# Patient Record
Sex: Female | Born: 1950 | Hispanic: No | Marital: Married | Smoking: Never smoker
Health system: Southern US, Community
[De-identification: ages and names within clinical notes are randomized; demographics above are authoritative.]

## PROBLEM LIST (undated history)

## (undated) ENCOUNTER — Emergency Department (HOSPITAL_COMMUNITY): Admission: EM | Payer: PRIVATE HEALTH INSURANCE | Source: Home / Self Care

## (undated) DIAGNOSIS — M1990 Unspecified osteoarthritis, unspecified site: Secondary | ICD-10-CM

## (undated) DIAGNOSIS — S82899A Other fracture of unspecified lower leg, initial encounter for closed fracture: Secondary | ICD-10-CM

## (undated) DIAGNOSIS — I471 Supraventricular tachycardia, unspecified: Secondary | ICD-10-CM

## (undated) DIAGNOSIS — J45909 Unspecified asthma, uncomplicated: Secondary | ICD-10-CM

## (undated) DIAGNOSIS — T7840XA Allergy, unspecified, initial encounter: Secondary | ICD-10-CM

## (undated) DIAGNOSIS — N189 Chronic kidney disease, unspecified: Secondary | ICD-10-CM

## (undated) DIAGNOSIS — E079 Disorder of thyroid, unspecified: Secondary | ICD-10-CM

## (undated) HISTORY — DX: Supraventricular tachycardia: I47.1

## (undated) HISTORY — DX: Allergy, unspecified, initial encounter: T78.40XA

## (undated) HISTORY — DX: Other fracture of unspecified lower leg, initial encounter for closed fracture: S82.899A

## (undated) HISTORY — DX: Chronic kidney disease, unspecified: N18.9

## (undated) HISTORY — DX: Unspecified osteoarthritis, unspecified site: M19.90

## (undated) HISTORY — DX: Unspecified asthma, uncomplicated: J45.909

## (undated) HISTORY — PX: FRACTURE SURGERY: SHX138

## (undated) HISTORY — DX: Supraventricular tachycardia, unspecified: I47.10

---

## 1978-11-10 DIAGNOSIS — S82899A Other fracture of unspecified lower leg, initial encounter for closed fracture: Secondary | ICD-10-CM

## 1978-11-10 HISTORY — PX: ANKLE SURGERY: SHX546

## 1978-11-10 HISTORY — DX: Other fracture of unspecified lower leg, initial encounter for closed fracture: S82.899A

## 1998-04-18 ENCOUNTER — Ambulatory Visit (HOSPITAL_COMMUNITY): Admission: RE | Admit: 1998-04-18 | Discharge: 1998-04-18 | Payer: Self-pay | Admitting: Family Medicine

## 2008-12-05 ENCOUNTER — Other Ambulatory Visit: Admission: RE | Admit: 2008-12-05 | Discharge: 2008-12-05 | Payer: Self-pay | Admitting: Family Medicine

## 2012-07-10 ENCOUNTER — Ambulatory Visit (INDEPENDENT_AMBULATORY_CARE_PROVIDER_SITE_OTHER): Payer: BC Managed Care – PPO | Admitting: Family Medicine

## 2012-07-10 VITALS — BP 112/73 | HR 65 | Temp 97.9°F | Resp 18 | Ht 65.0 in | Wt 154.0 lb

## 2012-07-10 DIAGNOSIS — M791 Myalgia, unspecified site: Secondary | ICD-10-CM

## 2012-07-10 DIAGNOSIS — R5381 Other malaise: Secondary | ICD-10-CM

## 2012-07-10 DIAGNOSIS — IMO0001 Reserved for inherently not codable concepts without codable children: Secondary | ICD-10-CM

## 2012-07-10 DIAGNOSIS — R5383 Other fatigue: Secondary | ICD-10-CM

## 2012-07-10 DIAGNOSIS — R609 Edema, unspecified: Secondary | ICD-10-CM

## 2012-07-10 DIAGNOSIS — M2550 Pain in unspecified joint: Secondary | ICD-10-CM

## 2012-07-10 DIAGNOSIS — R531 Weakness: Secondary | ICD-10-CM

## 2012-07-10 LAB — COMPREHENSIVE METABOLIC PANEL
ALT: 13 U/L (ref 0–35)
AST: 11 U/L (ref 0–37)
Albumin: 4.2 g/dL (ref 3.5–5.2)
Alkaline Phosphatase: 64 U/L (ref 39–117)
BUN: 15 mg/dL (ref 6–23)
CO2: 27 mEq/L (ref 19–32)
Calcium: 9.3 mg/dL (ref 8.4–10.5)
Chloride: 103 mEq/L (ref 96–112)
Creat: 0.62 mg/dL (ref 0.50–1.10)
Glucose, Bld: 96 mg/dL (ref 70–99)
Potassium: 4.3 mEq/L (ref 3.5–5.3)
Sodium: 137 mEq/L (ref 135–145)
Total Bilirubin: 0.4 mg/dL (ref 0.3–1.2)
Total Protein: 7.5 g/dL (ref 6.0–8.3)

## 2012-07-10 LAB — POCT CBC
Granulocyte percent: 74.6 %G (ref 37–80)
HCT, POC: 37 % — AB (ref 37.7–47.9)
Hemoglobin: 11.1 g/dL — AB (ref 12.2–16.2)
Lymph, poc: 1.4 (ref 0.6–3.4)
MCH, POC: 26.4 pg — AB (ref 27–31.2)
MCHC: 30 g/dL — AB (ref 31.8–35.4)
MCV: 88.2 fL (ref 80–97)
MID (cbc): 0.5 (ref 0–0.9)
MPV: 8.4 fL (ref 0–99.8)
POC Granulocyte: 5.7 (ref 2–6.9)
POC LYMPH PERCENT: 18.2 %L (ref 10–50)
POC MID %: 7.2 %M (ref 0–12)
Platelet Count, POC: 368 10*3/uL (ref 142–424)
RBC: 4.2 M/uL (ref 4.04–5.48)
RDW, POC: 14 %
WBC: 7.6 10*3/uL (ref 4.6–10.2)

## 2012-07-10 LAB — TSH: TSH: 3.978 u[IU]/mL (ref 0.350–4.500)

## 2012-07-10 LAB — POCT SEDIMENTATION RATE: POCT SED RATE: 57 mm/hr — AB (ref 0–22)

## 2012-07-10 NOTE — Progress Notes (Signed)
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  She's noticed some nodularity on several of her fingers, and swelling above the right clavicle as well.  No known tick exposure.  She does have cats that have had ticks.  No rash, cough, nausea No headaches, changes in vision.  Objective:  NAD Chest: clear.  There is a soft tissue swelling in the right supraclavicular area Heart:  Reg, no murmur or rub Abdoment:  No HSM, tenderness, or masses.  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  1. Weakness generalized  POCT SEDIMENTATION RATE, POCT CBC, Comprehensive metabolic panel, Rocky mtn spotted fvr ab, IgM-blood, TSH  2. Myalgia  POCT SEDIMENTATION RATE, POCT CBC, Comprehensive metabolic panel, Rocky mtn spotted fvr ab, IgM-blood, TSH  3. Swelling    4. Joint pain  Rocky mtn spotted fvr ab, IgM-blood, TSH, B. burgdorfi antibodies

## 2012-07-13 LAB — B. BURGDORFI ANTIBODIES: B burgdorferi Ab IgG+IgM: 0.36 {ISR}

## 2012-07-13 LAB — ROCKY MTN SPOTTED FVR AB, IGM-BLOOD: ROCKY MTN SPOTTED FEVER, IGM: 0.18 IV

## 2012-07-14 ENCOUNTER — Encounter: Payer: Self-pay | Admitting: *Deleted

## 2012-08-15 ENCOUNTER — Emergency Department (HOSPITAL_COMMUNITY)
Admission: EM | Admit: 2012-08-15 | Discharge: 2012-08-15 | Disposition: A | Discharge status: Home Health | Payer: BC Managed Care – PPO | Attending: Emergency Medicine | Admitting: Emergency Medicine

## 2012-08-15 ENCOUNTER — Emergency Department (HOSPITAL_COMMUNITY): Payer: BC Managed Care – PPO

## 2012-08-15 ENCOUNTER — Encounter (HOSPITAL_COMMUNITY): Payer: Self-pay | Admitting: *Deleted

## 2012-08-15 DIAGNOSIS — I498 Other specified cardiac arrhythmias: Secondary | ICD-10-CM | POA: Insufficient documentation

## 2012-08-15 DIAGNOSIS — E079 Disorder of thyroid, unspecified: Secondary | ICD-10-CM | POA: Insufficient documentation

## 2012-08-15 DIAGNOSIS — I471 Supraventricular tachycardia: Secondary | ICD-10-CM

## 2012-08-15 DIAGNOSIS — R002 Palpitations: Secondary | ICD-10-CM | POA: Insufficient documentation

## 2012-08-15 HISTORY — DX: Disorder of thyroid, unspecified: E07.9

## 2012-08-15 LAB — TROPONIN I: Troponin I: <0.3 ng/mL (ref <0.30)

## 2012-08-15 LAB — CBC WITH DIFFERENTIAL/PLATELET
Basophils Absolute: 0 10*3/uL (ref 0.0–0.1)
Eosinophils Relative: 1 % (ref 0–5)
Lymphocytes Relative: 22 % (ref 12–46)
MCV: 82.9 fL (ref 78.0–100.0)
Neutrophils Relative %: 70 % (ref 43–77)
Platelets: 358 10*3/uL (ref 150–400)
RDW: 13.5 % (ref 11.5–15.5)
WBC: 11.8 10*3/uL — ABNORMAL HIGH (ref 4.0–10.5)

## 2012-08-15 LAB — BASIC METABOLIC PANEL
CO2: 21 mEq/L (ref 19–32)
Calcium: 9.8 mg/dL (ref 8.4–10.5)
GFR calc Af Amer: >90 mL/min (ref >90)
Sodium: 137 mEq/L (ref 135–145)

## 2012-08-15 LAB — MAGNESIUM: Magnesium: 2.1 mg/dL (ref 1.5–2.5)

## 2012-08-15 MED ORDER — METOPROLOL TARTRATE 25 MG PO TABS
25.0000 mg | ORAL_TABLET | Freq: Once | ORAL | Status: AC
  Administered 2012-08-15: 25 mg via ORAL
  Filled 2012-08-15: qty 1

## 2012-08-15 MED ORDER — ADENOSINE 6 MG/2ML IV SOLN
INTRAVENOUS | Status: AC
  Administered 2012-08-15: 6 mg
  Filled 2012-08-15: qty 6

## 2012-08-15 MED ORDER — METOPROLOL TARTRATE 25 MG PO TABS: 100.0000 mg | ORAL_TABLET | Freq: Two times a day (BID) | ORAL | Status: DC

## 2012-08-15 NOTE — ED Notes (Signed)
Pt in c/o palpitations and chest pain since yesterday morning, pt has history of same, last episode approx 1 week ago, pt alert and oriented, diaphoretic

## 2012-08-15 NOTE — ED Notes (Signed)
Patient transported to X-ray 

## 2012-08-15 NOTE — ED Provider Notes (Signed)
History     CSN: [id number]  Arrival date & time 08/15/12  4401   First MD Initiated Contact with Patient 08/15/12 [phone]      Chief Complaint  Patient presents with  . Chest Pain  . Palpitations    (Consider location/radiation/quality/duration/timing/severity/associated sxs/prior treatment) Patient is a 61 y.o. female presenting with chest pain and palpitations. The history is provided by the patient.  Chest Pain The chest pain began yesterday. Primary symptoms include palpitations. Pertinent negatives for primary symptoms include no shortness of breath, no abdominal pain, no nausea and no vomiting.  The palpitations did not occur with shortness of breath.  Pertinent negatives for associated symptoms include no numbness and no weakness.    Palpitations  Associated symptoms include chest pain. Pertinent negatives include no numbness, no abdominal pain, no nausea, no vomiting, no headaches, no back pain, no weakness and no shortness of breath.  patient with chest pressure and tightness in her chest and shoulders since yesterday morning. She also is felt her heart going fast. She has a previous history of SVT. She was seen several years ago by [facility] cardiology. She's had occasional episodes since, the last one was a week ago.that one resolve on its own. Patient states this episode has been constant. No relief with the methods that she try. No fevers. No cough. She some mild shortness of breath with it. She is on thyroid medication but states levels been good. She states that she drinks a lot of caffeine. No weight loss. The lightheadedness or dizziness.  Past Medical History  Diagnosis Date  . Thyroid disease     History reviewed. No pertinent past surgical history.  Family History  Problem Relation Age of Onset  . Dementia Mother   . Heart disease Father   . Diabetes Maternal Uncle   . Diabetes Maternal Grandmother     History  Substance Use Topics  . Smoking status: Never  Smoker   . Smokeless tobacco: Not on file  . Alcohol Use: No    OB History    Grav Para Term Preterm Abortions TAB SAB Ect Mult Living                  Review of Systems  Constitutional: Negative for activity change, appetite change and unexpected weight change.  HENT: Negative for neck stiffness.   Eyes: Negative for pain.  Respiratory: Negative for chest tightness and shortness of breath.   Cardiovascular: Positive for chest pain and palpitations. Negative for leg swelling.  Gastrointestinal: Negative for nausea, vomiting, abdominal pain and diarrhea.  Genitourinary: Negative for flank pain.  Musculoskeletal: Negative for back pain.  Skin: Negative for rash.  Neurological: Negative for weakness, numbness and headaches.  Psychiatric/Behavioral: Negative for behavioral problems.    Allergies  Review of patient's allergies indicates no known allergies.  Home Medications   Current Outpatient Rx  Name Route Sig Dispense Refill  . THYROID 180 MG PO TABS Oral Take 180 mg by mouth daily.    Marland Kitchen METOPROLOL TARTRATE 25 MG PO TABS Oral Take 4 tablets (100 mg total) by mouth 2 (two) times daily. 30 tablet 0    BP 121/78  Pulse 65  Temp 97.9 F (36.6 C) (Oral)  Resp 12  SpO2 100%  Physical Exam  Nursing note and vitals reviewed. Constitutional: She is oriented to person, place, and time. She appears well-developed and well-nourished.  HENT:  Head: Normocephalic and atraumatic.  Eyes: EOM are normal. Pupils are equal, round, and  reactive to light.  Neck: Normal range of motion. Neck supple.  Cardiovascular: Regular rhythm and normal heart sounds.   No murmur heard.      Severe tachycardia  Pulmonary/Chest: Effort normal and breath sounds normal. No respiratory distress. She has no wheezes. She has no rales.  Abdominal: Soft. Bowel sounds are normal. She exhibits no distension. There is no tenderness. There is no rebound and no guarding.  Musculoskeletal: Normal range of  motion.  Neurological: She is alert and oriented to person, place, and time. No cranial nerve deficit.  Skin: Skin is warm and dry.  Psychiatric: She has a normal mood and affect. Her speech is normal.    ED Course  Procedures (including critical care time)  Labs Reviewed  CBC WITH DIFFERENTIAL - Abnormal; Notable for the following:    WBC 11.8 (*)     Neutro Abs 8.3 (*)     All other components within normal limits  BASIC METABOLIC PANEL - Abnormal; Notable for the following:    Glucose, Bld 152 (*)     GFR calc non Af Amer 89 (*)     All other components within normal limits  TSH - Abnormal; Notable for the following:    TSH 5.283 (*)     All other components within normal limits  TROPONIN I  MAGNESIUM  TROPONIN I  LAB REPORT - SCANNED   Dg Chest 2 View  08/15/2012  *RADIOLOGY REPORT*  Clinical Data: Shortness of breath.  Chest pain.  Supraventricular tachycardia.  CHEST - 2 VIEW  Comparison: None.  Findings: Cardiac and mediastinal contours appear normal.  The lungs appear clear.  No pleural effusion is identified.  IMPRESSION:  No significant abnormality identified.   Original Report Authenticated By: Dellia Cloud, M.D.      1. SVT (supraventricular tachycardia)      Date: 08/15/2012  Rate: 197  Rhythm: supraventricular tachycardia (SVT)  QRS Axis: normal  Intervals: svt  ST/T Wave abnormalities: nonspecific ST/T changes  Conduction Disutrbances:none  Narrative Interpretation:   Old EKG Reviewed: none available    MDM  She presented in SVT for the last 24 hours. Some associated chest pain resolved with resolution of the SVT. Patient was converted with 6 mg of adenosine. After discussion with cardiology patient will followup in the office. She was started on metoprolol twice a day. TSH is pending        Juliet Rude. Rubin Payor, MD 08/16/12 [phone]

## 2012-08-15 NOTE — ED Notes (Signed)
Pt talking with husband. Denies pain or shob. Remains in NSR

## 2012-08-15 NOTE — ED Notes (Signed)
Pt states that she began to feel her heart race yesterday am, tried to slow it down with methods that that she learned but nothing worked. Pt c/o pressure in chest with rapid heartrate that went away after heart rate back to normal

## 2012-09-01 ENCOUNTER — Other Ambulatory Visit: Payer: Self-pay | Admitting: Family Medicine

## 2012-09-01 DIAGNOSIS — R222 Localized swelling, mass and lump, trunk: Secondary | ICD-10-CM

## 2012-09-03 ENCOUNTER — Other Ambulatory Visit: Payer: BC Managed Care – PPO

## 2012-09-29 ENCOUNTER — Ambulatory Visit (INDEPENDENT_AMBULATORY_CARE_PROVIDER_SITE_OTHER): Payer: BC Managed Care – PPO | Admitting: Family Medicine

## 2012-09-29 VITALS — BP 130/85 | HR 80 | Temp 98.1°F | Resp 18 | Ht 64.0 in | Wt 154.0 lb

## 2012-09-29 DIAGNOSIS — Z0000 Encounter for general adult medical examination without abnormal findings: Secondary | ICD-10-CM

## 2012-09-29 DIAGNOSIS — M2550 Pain in unspecified joint: Secondary | ICD-10-CM

## 2012-09-29 DIAGNOSIS — M7500 Adhesive capsulitis of unspecified shoulder: Secondary | ICD-10-CM

## 2012-09-29 NOTE — Progress Notes (Signed)
Complete Physical Exam  History: 61 year old lady who is here for physical examination. Her insurance or pars physical exams to keep the pregnancy I also. No other major trigger for today's exam. She has had some problems, including episodes of SVT this year. She was worked up for that, and has been on Lopressor to attempt to control heart rate. She also is on thyroid, and even though her last level was not quite perfect they felt I should not increase medications at that time due to the SVT. She's being followed by physician at [facility] family for that.  Past medical history: Medical illnesses: Allergies History of kidney stones Hypothyroidism SVT  Past surgical history she's had several fractures and had surgery with screws in her right ankle. She's had a Jones fracture and broken O. the left forearm fracture. No other major operations. Medications  Medication allergies: None  Current medications: Armour Thyroid 3 gr every morning Lopressor 25 mg twice daily  Family history: Both parents are deceased. Her mother had dementia in her father heart disease. She has no siblings. Her one son is adopted, has ADD.  Social history: Not currently sexually involved. She is married. 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 She rarely drinks any alcohol and does not use any drugs. Assessment. She exercises a couple times a week doing water exercise. She has had some physical therapy in the last year, which helps to limber her shoulder up and gets bad again. Use  Review of systems: Constitutional: She has persistent fatigue. Just doesn't feel like doing anything by the end of a workday. Her brain even seems to shut down. She has had decreased activity level. Part of this is her musculoskeletal pains and dysfunctions. HEENT: Has neck pain and neck stiffness. Otherwise unremarkable Respiratory: As wheezing intermittently Cardiovascular: History  of SVT. As episodes still despite taking the beta blocker. GI: Unremarkable GU: Unremarkable Musculoskeletal: Joint pains joint swellings and muscle pains and problems walking. The feet seem to be the worst of things along with the right shoulder. This morning her left arm but not work well and she is grip in her left hand. Skin: Unremarkable Neurologic: Unremarkable except for the problems with abnormal sensations urinary feet Hematologic: Unremarkable Psychiatric: Decreased concentration as discussed above Endocrine: Hypothyroidism as noted above  Physical examination alert generally healthy appearing lady in no major distress when just sitting still. Her TMs are normal. Eyes PERRLA. Throat clear. Neck supple without nodes thyromegaly. No carotid bruits. Chest is clear to auscultation. No wheezing is heard at this time. Heart regular without murmurs gallops or arrhythmias. She is not tachycardic at this time. Breasts symmetrical no masses palpated no axillary nodes. And soft without mass tenderness. Normal female external genitalia. Vaginal mucosa unremarkable. Cervix appears nulliparous, benign. Pap was taken. Bimanual exam reveals uterus to be anterior with no adnexal or uterine masses. Extremities are without edema this time. Her range of motion of the right shoulder is very limited. She cannot abduct it. 4. Skin unremarkable.  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 Decided not to repeat these studies. She will followup on her thyroid with her PCP. Recommended she see an orthopedist for the right shoulder, and  see a rheumatologist for the other pains which she is having to see if a pattern and  treatment can be determined.  Pap was taken and is pending

## 2012-09-29 NOTE — Patient Instructions (Addendum)
You will hear from our office regarding the referrals. Should you not hear of the coming week please call back and speak to the referrals desk.

## 2012-09-30 LAB — PAP IG (IMAGE GUIDED)

## 2012-11-08 ENCOUNTER — Ambulatory Visit
Admission: RE | Admit: 2012-11-08 | Discharge: 2012-11-08 | Disposition: A | Discharge status: Home / Self Care | Payer: BC Managed Care – PPO | Source: Ambulatory Visit | Attending: Family Medicine | Admitting: Family Medicine

## 2012-11-08 DIAGNOSIS — R222 Localized swelling, mass and lump, trunk: Secondary | ICD-10-CM

## 2012-11-08 MED ORDER — IOHEXOL 300 MG/ML  SOLN
75.0000 mL | Freq: Once | INTRAMUSCULAR | Status: AC | PRN
  Administered 2012-11-08: 75 mL via INTRAVENOUS

## 2013-01-06 IMAGING — CR DG CHEST 2V
2 series · 2 of 2 positions shown · non-contrast
Comparison: None.

CLINICAL DATA: Shortness of breath.  Chest pain.  Supraventricular
tachycardia.

CHEST - 2 VIEW

[w chest pa]
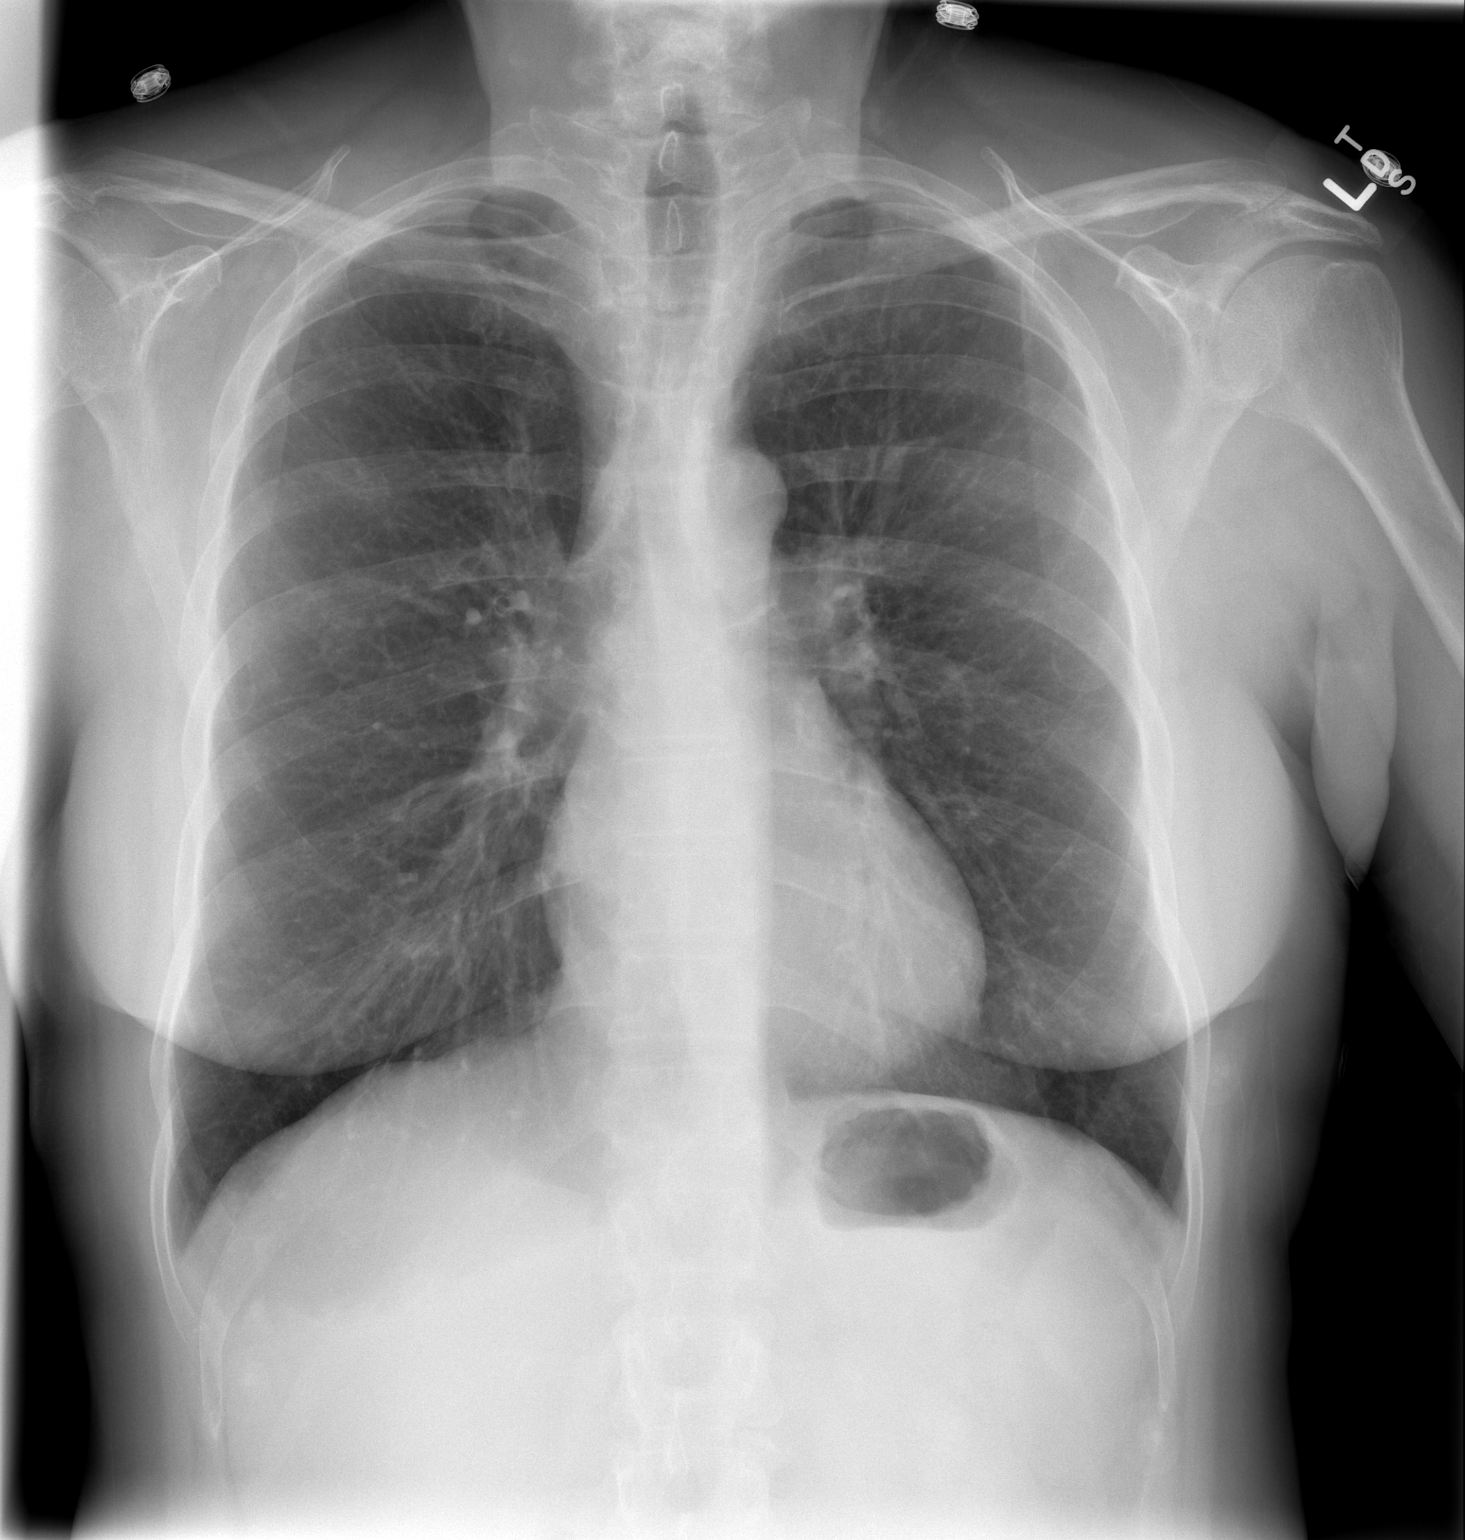

[w chest lat]
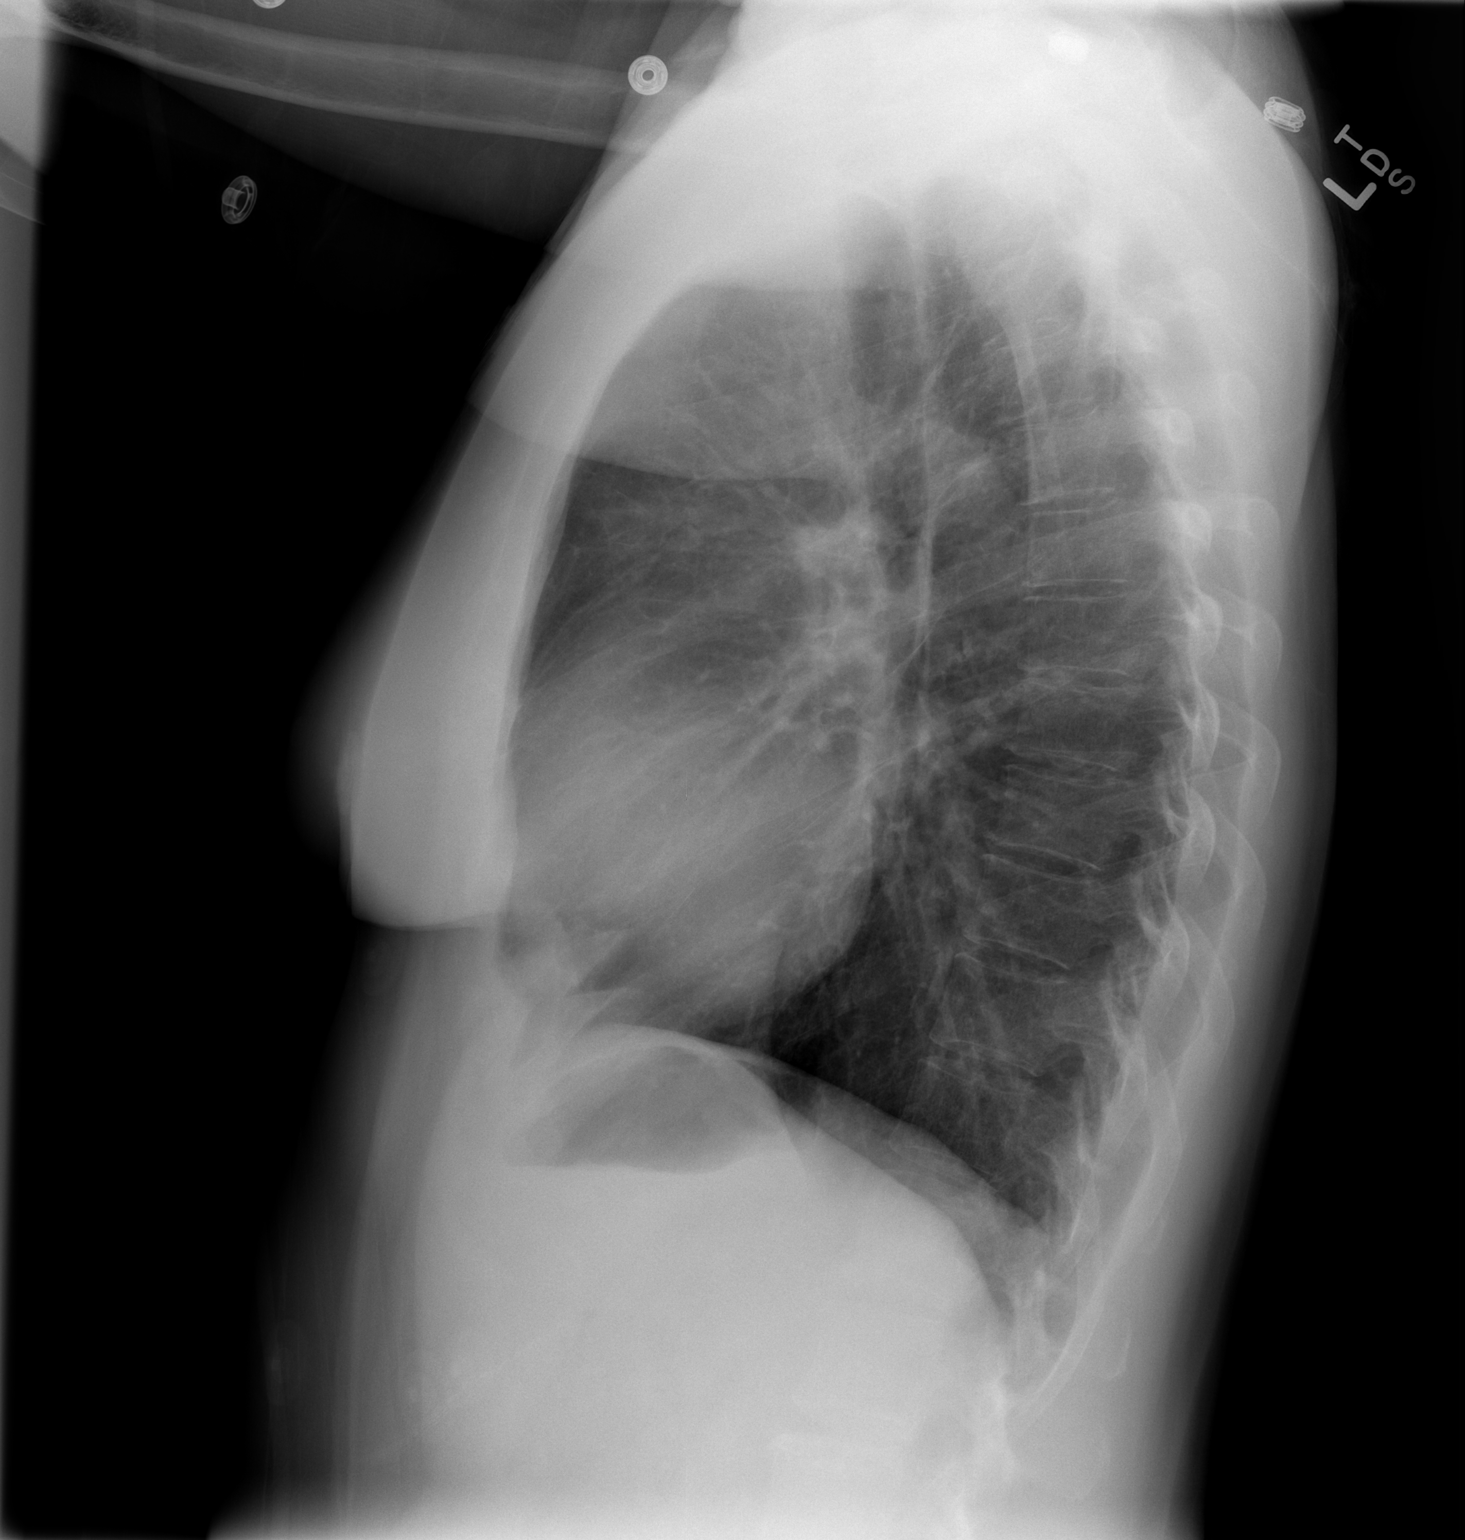

[2 of 2 positions shown; findings below may reference images not displayed]

FINDINGS: Cardiac and mediastinal contours appear normal.

The lungs appear clear.

No pleural effusion is identified.
IMPRESSION: No significant abnormality identified.

## 2013-04-25 ENCOUNTER — Other Ambulatory Visit: Payer: Self-pay | Admitting: Family Medicine

## 2013-04-25 DIAGNOSIS — R918 Other nonspecific abnormal finding of lung field: Secondary | ICD-10-CM

## 2013-04-28 ENCOUNTER — Ambulatory Visit
Admission: RE | Admit: 2013-04-28 | Discharge: 2013-04-28 | Disposition: A | Discharge status: Home / Self Care | Payer: BC Managed Care – PPO | Source: Ambulatory Visit | Attending: Family Medicine | Admitting: Family Medicine

## 2013-04-28 DIAGNOSIS — R918 Other nonspecific abnormal finding of lung field: Secondary | ICD-10-CM

## 2013-08-05 ENCOUNTER — Ambulatory Visit (INDEPENDENT_AMBULATORY_CARE_PROVIDER_SITE_OTHER): Payer: 59 | Admitting: Emergency Medicine

## 2013-08-05 ENCOUNTER — Ambulatory Visit: Payer: 59

## 2013-08-05 VITALS — BP 120/76 | HR 66 | Temp 98.2°F | Resp 16 | Ht 64.25 in | Wt 154.6 lb

## 2013-08-05 DIAGNOSIS — S5292XA Unspecified fracture of left forearm, initial encounter for closed fracture: Secondary | ICD-10-CM

## 2013-08-05 DIAGNOSIS — S82899A Other fracture of unspecified lower leg, initial encounter for closed fracture: Secondary | ICD-10-CM | POA: Insufficient documentation

## 2013-08-05 DIAGNOSIS — S5290XA Unspecified fracture of unspecified forearm, initial encounter for closed fracture: Secondary | ICD-10-CM

## 2013-08-05 DIAGNOSIS — M25522 Pain in left elbow: Secondary | ICD-10-CM

## 2013-08-05 DIAGNOSIS — M25529 Pain in unspecified elbow: Secondary | ICD-10-CM

## 2013-08-05 MED ORDER — MELOXICAM 15 MG PO TABS: 15.0000 mg | ORAL_TABLET | Freq: Every day | ORAL | Status: DC

## 2013-08-05 NOTE — Patient Instructions (Signed)
Wear the sling for comfort.   Remove it to gently move the elbow and shoulder once or twice daily.  Move the wrist and hand frequently.

## 2013-08-05 NOTE — Progress Notes (Signed)
I have examined this patient along with the student and agree.  

## 2013-08-05 NOTE — Progress Notes (Signed)
Subjective:    Patient ID: Kelsey Hart, female    DOB: 1950/12/29, 62 y.o.   MRN: [id number]  HPI  [name] is a left handed,  62 year old Caucasian female here today with left elbow pain after a fall last night. Patient fell climbing over something yesterday and broke the fall with left arm outstretched. Pain in left elbow happened immediately, some pain down to wrist. NO tingling or numbness. She did not hit her head or any other part of her body. She did no lose consciousness.  Hard to straighten elbow or lift things up. Pronation/supination painful in forearm near elbow. Taking Extra Strength Tylenol which is helping with the pain. Some slight swelling in elbow.   Today, patient has pain in her left thumb and slight swelling. No decreased function in the thumb, hand, or wrist. Patient has broken several bones in the past. [name] does not think she has osteoporosis or osteopenia, last DEXA +20 years. She does take medications for RA and hypothyroidism.  Review of Systems    as above Objective:   Physical Exam  Constitutional: She appears well-developed and well-nourished. No distress.  HENT:  Head: Normocephalic and atraumatic.  Cardiovascular: Normal rate, regular rhythm, normal heart sounds and intact distal pulses.   Pulses:      Radial pulses are 2+ on the right side, and 2+ on the left side.  Pulmonary/Chest: Effort normal and breath sounds normal.  Musculoskeletal:       Right shoulder: Normal.       Left shoulder: Normal. She exhibits no swelling, no effusion, no deformity and normal strength. Pain: pain radiates to left lateral epicondyle but no pain over shoulder with ROM.       Right elbow: Normal.      Left elbow: She exhibits swelling. She exhibits no laceration. Decreased range of motion: unable to fully extend elbow, pain with ROM especially supination and pronation. Tenderness found. Radial head and lateral epicondyle tenderness noted. No medial epicondyle and  no olecranon process tenderness noted.       Right wrist: Normal.       Left wrist: She exhibits normal range of motion, no tenderness, no bony tenderness and no deformity.       Right hand: Normal.       Left hand: She exhibits normal range of motion, no tenderness, no bony tenderness, normal capillary refill and no deformity. Swelling: mild swelling of left thumb. Normal sensation noted. Decreased strength noted. She exhibits thumb/finger opposition.  Skin: Skin is warm and dry. No abrasion, no bruising, no ecchymosis, no laceration and no petechiae noted.  Psychiatric: She has a normal mood and affect.     Left elbow xray read by Dr. Cleta Alberts. Impression: proximal radial fracture, compacted and closed. No displacement of radial head. Mild posterior effusion.      Assessment & Plan:  Elbow pain, left - Plan: DG Elbow Complete Left, meloxicam (MOBIC) 15 MG tablet  Radial fracture, left, closed, initial encounter  F/U in office in 10 days for re-evaluation and repeat xray. Wear elbow sling until visit next week. Perform gently extension of elbow but stop if pain. Move wrist and shoulder out of sling to prevent freezing of the joints. Take Meloxicam 1 tab daily for pain, call us if pain is not resolving with meloxicam. Continue ice for the next 24 hours to reduce swelling.   Consider f/u DEXA once fracture is resolved since last was +30 years ago  and patient has hx of broken bones.

## 2013-08-10 NOTE — Progress Notes (Signed)
Appt made for 10/7 with Chelle.

## 2013-08-16 ENCOUNTER — Ambulatory Visit: Payer: 59

## 2013-08-16 ENCOUNTER — Ambulatory Visit (INDEPENDENT_AMBULATORY_CARE_PROVIDER_SITE_OTHER): Payer: 59 | Admitting: Emergency Medicine

## 2013-08-16 ENCOUNTER — Encounter: Payer: Self-pay | Admitting: Physician Assistant

## 2013-08-16 VITALS — BP 122/78 | HR 56 | Temp 98.0°F | Resp 16 | Ht 64.25 in | Wt 157.0 lb

## 2013-08-16 DIAGNOSIS — S5290XD Unspecified fracture of unspecified forearm, subsequent encounter for closed fracture with routine healing: Secondary | ICD-10-CM

## 2013-08-16 DIAGNOSIS — S52122D Displaced fracture of head of left radius, subsequent encounter for closed fracture with routine healing: Secondary | ICD-10-CM

## 2013-08-16 DIAGNOSIS — Z23 Encounter for immunization: Secondary | ICD-10-CM

## 2013-08-16 NOTE — Progress Notes (Signed)
  Subjective:    Patient ID: Kelsey Hart, female    DOB: Jan 07, 1951, 63 y.o.   MRN: [id number]  HPI  Kelsey Hart is a 62 year old female who is here today to follow up on a left elbow fracture. She is no longer using the sling or taking Meloxicam. Her pain has decreased significantly and she takes Tylenol as needed for the pain. She still has some decreased range of motion at the elbow, specifically with supination and pronation. She is unable to fully extend or flex her elbow. She has some pain in her wrist with certain movements of her elbow but no pain over wrist.    Review of Systems As above    Objective:   Physical Exam  Musculoskeletal:       Right elbow: Normal.      Left elbow: She exhibits decreased range of motion (cannot fully extend or flex elbow joint, decreased supination and pronation). She exhibits no swelling. No tenderness found.       Right wrist: Normal.       Left wrist: She exhibits normal range of motion, no tenderness and no bony tenderness.       Right hand: Normal strength noted.       Left hand: She exhibits normal range of motion and no tenderness. Decreased strength (grip strength decreased compared to right) noted.     Left elbow xray read by Dr. Cleta Alberts: Fracture more easily seen on radial head going into joint space. No evidence of secondary fracture. Healing.     Assessment & Plan:  Radial head fracture, left, closed, with routine healing, subsequent encounter - Plan: DG Elbow Complete Left  Need for prophylactic vaccination and inoculation against influenza - Plan: Flu Vaccine QUAD 36+ mos IM  Continue ROM exercises at elbow, Tylenol and/or Ibuprofen for pain.  Deferred referral to PT for now as patient is willing and able to do exercises at home. F/U in 2 weeks to make sure fracture is continuing to heal.

## 2013-08-16 NOTE — Progress Notes (Signed)
I have examined this patient along with the student and agree.  

## 2013-08-30 ENCOUNTER — Ambulatory Visit: Payer: 59

## 2013-08-30 ENCOUNTER — Encounter: Payer: Self-pay | Admitting: Physician Assistant

## 2013-08-30 ENCOUNTER — Ambulatory Visit (INDEPENDENT_AMBULATORY_CARE_PROVIDER_SITE_OTHER): Payer: 59 | Admitting: Emergency Medicine

## 2013-08-30 VITALS — BP 130/78 | HR 62 | Temp 97.7°F | Resp 16 | Ht 64.5 in | Wt 155.4 lb

## 2013-08-30 DIAGNOSIS — M6281 Muscle weakness (generalized): Secondary | ICD-10-CM

## 2013-08-30 DIAGNOSIS — M069 Rheumatoid arthritis, unspecified: Secondary | ICD-10-CM | POA: Insufficient documentation

## 2013-08-30 DIAGNOSIS — S52122D Displaced fracture of head of left radius, subsequent encounter for closed fracture with routine healing: Secondary | ICD-10-CM

## 2013-08-30 DIAGNOSIS — S5290XD Unspecified fracture of unspecified forearm, subsequent encounter for closed fracture with routine healing: Secondary | ICD-10-CM

## 2013-08-30 DIAGNOSIS — R29898 Other symptoms and signs involving the musculoskeletal system: Secondary | ICD-10-CM

## 2013-08-30 NOTE — Progress Notes (Signed)
  Subjective:    Patient ID: Kelsey Hart, female    DOB: 01-07-1951, 62 y.o.   MRN: [id number]  HPI  This 62 y.o. female presents for evaluation of LEFT radial head fracture initially evaluated on 08/05/2013.  Follow-up on 08/16/2013 revealed clinical improvement in pain and ROM, but minimal radiographic evidence of healing.  Today she reports no pain in the elbow, and near complete ROM.  However, grip strength on the LEFT remains diminished.  This was initially thought secondary to pain, but persists now despite resolution of pain.  No numbness or tingling. Some pain in the distal forearm/wrist with certain movements. Isn't dropping things, but is being careful.   Review of Systems As above.    Objective:   Physical Exam  BP 130/78  Pulse 62  Temp(Src) 97.7 F (36.5 C) (Oral)  Resp 16  Ht 5' 4.5" (1.638 m)  Wt 155 lb 6.4 oz (70.489 kg)  BMI 26.27 kg/m2  SpO2 99% WDWNWF, A&O x 3. Near complete (>90%) ROM of the LEFT elbow compared with the RIGHT. No tenderness with palpation of the elbow, forearm or wrist. Strong and symmetric radial pulses. Capillary refill <3 seconds. Significantly decreased grip strength on the LEFT.  ELBOW: [facility] reading (PRIMARY) by  Dr. Cleta Alberts.  Non-displaced radial head fracture with continued, but incomplete, healing.      Assessment & Plan:  Radial head fracture, left, closed, with routine healing, subsequent encounter - Plan: DG Elbow 2 Views Left  Weakness of left hand - Plan: Ambulatory referral to Physical Therapy   RTC 4-6 weeks.   Fernande Bras, PA-C Physician Assistant-Certified [facility]

## 2013-09-22 ENCOUNTER — Ambulatory Visit: Payer: BC Managed Care – PPO | Admitting: Interventional Cardiology

## 2013-10-11 ENCOUNTER — Ambulatory Visit: Payer: 59 | Admitting: Physician Assistant

## 2013-10-26 ENCOUNTER — Encounter: Payer: Self-pay | Admitting: Interventional Cardiology

## 2013-10-26 DIAGNOSIS — T7840XA Allergy, unspecified, initial encounter: Secondary | ICD-10-CM | POA: Insufficient documentation

## 2013-10-26 DIAGNOSIS — E079 Disorder of thyroid, unspecified: Secondary | ICD-10-CM | POA: Insufficient documentation

## 2013-10-26 DIAGNOSIS — I471 Supraventricular tachycardia: Secondary | ICD-10-CM | POA: Insufficient documentation

## 2013-10-26 DIAGNOSIS — N189 Chronic kidney disease, unspecified: Secondary | ICD-10-CM | POA: Insufficient documentation

## 2013-10-27 ENCOUNTER — Ambulatory Visit (INDEPENDENT_AMBULATORY_CARE_PROVIDER_SITE_OTHER): Payer: 59 | Admitting: Interventional Cardiology

## 2013-10-27 ENCOUNTER — Encounter: Payer: Self-pay | Admitting: Interventional Cardiology

## 2013-10-27 VITALS — BP 120/68 | HR 62 | Ht 64.5 in | Wt 156.0 lb

## 2013-10-27 DIAGNOSIS — I498 Other specified cardiac arrhythmias: Secondary | ICD-10-CM

## 2013-10-27 DIAGNOSIS — I471 Supraventricular tachycardia: Secondary | ICD-10-CM

## 2013-10-27 MED ORDER — METOPROLOL TARTRATE 25 MG PO TABS: ORAL_TABLET | ORAL | Status: DC

## 2013-10-27 NOTE — Progress Notes (Signed)
Patient ID: Kelsey Hart, female   DOB: 1951-06-22, 62 y.o.   MRN: [id number]    [address] [place], [place]  [address] Phone: [phone] Fax:  [phone]  Date:  10/27/2013   ID:  Kelsey Hart Aug 23, 1951, MRN [id number]  PCP:  Cala Bradford, MD      History of Present Illness: Kelsey Hart is a 62 y.o. female who has had SVT. She was started on metoprolol, but had no diffference in sx in 2013. She stopped the metoprolol after a GI bug a year ago. She has 6-12 episodes in a month lasting minutes to an hour.  She did have a gap of 3 months where there were only 2 epsidoes. Palpitations:  some dizziness. No syncope. Does water aerobics. No definite triggers for SVT.  Maybe stress. c/o Shortness of breath with palpitations.  Denies : Dyspnea on exertion.     Wt Readings from Last 3 Encounters:  10/27/13 156 lb (70.761 kg)  08/30/13 155 lb 6.4 oz (70.489 kg)  08/16/13 157 lb (71.215 kg)     Past Medical History  Diagnosis Date  . Thyroid disease   . Allergy   . Chronic kidney disease   . SVT (supraventricular tachycardia)   . Arthritis     Rheumatoid  . Broken ankle 1980    Current Outpatient Prescriptions  Medication Sig Dispense Refill  . Adalimumab (HUMIRA PEN Carmen) Inject into the skin.      . folic acid (FOLVITE) 1 MG tablet Take 1 mg by mouth daily.      Marland Kitchen lactobacillus acidophilus (BACID) TABS tablet Take 1 tablet by mouth once.      . methotrexate (RHEUMATREX) 2.5 MG tablet Take 2.5 mg by mouth once a week. Caution:Chemotherapy. Protect from light.      . Multiple Vitamin (MULTIVITAMIN) tablet Take 1 tablet by mouth daily.      Marland Kitchen thyroid (ARMOUR) 180 MG tablet Take 180 mg by mouth daily.       No current facility-administered medications for this visit.    Allergies:   No Known Allergies  Social History:  The patient  reports that she has never smoked. She does not have any smokeless tobacco history on file. She reports that she does  not drink alcohol or use illicit drugs.   Family History:  The patient's family history includes Dementia in her maternal grandmother and mother; Diabetes in her maternal grandmother and maternal uncle; Heart disease in her father and paternal grandmother; Stroke in her maternal grandfather and paternal grandfather.   ROS:  Please see the history of present illness.  No nausea, vomiting.  No fevers, chills.  No focal weakness.  No dysuria.    All other systems reviewed and negative.   PHYSICAL EXAM: VS:  BP 120/68  Pulse 62  Ht 5' 4.5" (1.638 m)  Wt 156 lb (70.761 kg)  BMI 26.37 kg/m2 Well nourished, well developed, in no acute distress HEENT: normal Neck: no JVD, no carotid bruits Cardiac:  normal S1, S2; RRR;  Lungs:  clear to auscultation bilaterally, no wheezing, rhonchi or rales Abd: soft, nontender, no hepatomegaly Ext: no edema Skin: warm and dry Neuro:   no focal abnormalities noted     ECG: Normal  ASSESSMENT AND PLAN:  Paroxysmal supraventricular tachycardia  Continue Metoprolol Tartrate Tablet, 25 mg, 1 tablets, Orally, twice a day as needed for palpitations Notes: 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 HR up to 197.  She is off of metoprolol. Reluctant to take it regularly. Will use it as needed for prolonged episodes.  She has not used it recently. Discussed referral to EP. she'll hold off for now. she is hoping sx will improve when off MTX, although rheum is not excited about getting her off of this medicine.   Signed, Fredric Mare, MD, [facility] 10/27/2013 9:23 AM

## 2013-10-27 NOTE — Patient Instructions (Signed)
Your physician wants you to follow-up in: 6 months with Dr. Eldridge Dace. You will receive a reminder letter in the mail two months in advance. If you don't receive a letter, please call our office to schedule the follow-up appointment.  Your physician has recommended you make the following change in your medication:   1. Start back taking Metoprolol 25 mg 1/2 tablet twice a day as needed for palpitations.

## 2014-01-03 ENCOUNTER — Encounter: Payer: 59 | Admitting: Physician Assistant

## 2015-05-17 ENCOUNTER — Other Ambulatory Visit: Payer: Self-pay | Admitting: Family Medicine

## 2015-05-17 ENCOUNTER — Other Ambulatory Visit (HOSPITAL_COMMUNITY)
Admission: RE | Admit: 2015-05-17 | Discharge: 2015-05-17 | Disposition: A | Discharge status: Home / Self Care | Payer: 59 | Source: Ambulatory Visit | Attending: Family Medicine | Admitting: Family Medicine

## 2015-05-17 DIAGNOSIS — Z01411 Encounter for gynecological examination (general) (routine) with abnormal findings: Secondary | ICD-10-CM | POA: Diagnosis not present

## 2015-05-17 DIAGNOSIS — Z1151 Encounter for screening for human papillomavirus (HPV): Secondary | ICD-10-CM | POA: Diagnosis present

## 2015-05-18 LAB — CYTOLOGY - PAP

## 2015-12-25 ENCOUNTER — Encounter: Payer: Self-pay | Admitting: Allergy and Immunology

## 2015-12-25 ENCOUNTER — Ambulatory Visit (INDEPENDENT_AMBULATORY_CARE_PROVIDER_SITE_OTHER): Payer: 59 | Admitting: Allergy and Immunology

## 2015-12-25 VITALS — BP 116/88 | HR 80 | Temp 98.0°F | Resp 14 | Ht 63.78 in | Wt 154.1 lb

## 2015-12-25 DIAGNOSIS — J4541 Moderate persistent asthma with (acute) exacerbation: Secondary | ICD-10-CM

## 2015-12-25 DIAGNOSIS — D899 Disorder involving the immune mechanism, unspecified: Secondary | ICD-10-CM | POA: Diagnosis not present

## 2015-12-25 DIAGNOSIS — J309 Allergic rhinitis, unspecified: Secondary | ICD-10-CM

## 2015-12-25 DIAGNOSIS — H1010 Acute atopic conjunctivitis, unspecified eye: Secondary | ICD-10-CM | POA: Diagnosis not present

## 2015-12-25 DIAGNOSIS — D849 Immunodeficiency, unspecified: Secondary | ICD-10-CM

## 2015-12-25 MED ORDER — ALBUTEROL SULFATE HFA 108 (90 BASE) MCG/ACT IN AERS: INHALATION_SPRAY | RESPIRATORY_TRACT | Status: DC

## 2015-12-25 MED ORDER — FLUTICASONE FUROATE-VILANTEROL 200-25 MCG/INH IN AEPB: 1.0000 | INHALATION_SPRAY | Freq: Every day | RESPIRATORY_TRACT | Status: DC

## 2015-12-25 MED ORDER — METHYLPREDNISOLONE ACETATE 80 MG/ML IJ SUSP
80.0000 mg | Freq: Once | INTRAMUSCULAR | Status: AC
  Administered 2015-12-25: 80 mg via INTRAMUSCULAR

## 2015-12-25 MED ORDER — MONTELUKAST SODIUM 10 MG PO TABS: ORAL_TABLET | ORAL | Status: DC

## 2015-12-25 NOTE — Progress Notes (Signed)
Dear Dr. Cliffton Asters,  Thank you for referring Anibal Henderson to the [facility] [place] on 12/25/2015.   Below is a summation of this patient's evaluation and recommendations.  Thank you for your referral. I will keep you informed about this patient's response to treatment.   If you have any questions please to do hestitate to contact me.   Sincerely,  Jessica Priest, MD [facility]   ______________________________________________________________________    NEW PATIENT NOTE  Referring Provider: Laurann Montana, MD Primary Provider: Cala Bradford, MD Date of office visit: 12/25/2015    Subjective:   Chief Complaint:  Kelsey Hart (DOB: 1951-03-10) is a 65 y.o. female with a chief complaint of Wheezing and Nasal Congestion  who presents to the clinic on 12/25/2015 with the following problems:  HPI  Past Medical History  Diagnosis Date  . Thyroid disease   . Allergy   . Chronic kidney disease   . SVT (supraventricular tachycardia) (HCC)   . Arthritis     Rheumatoid  . Broken ankle 1980    Past Surgical History  Procedure Laterality Date  . Fracture surgery    . Ankle surgery  1980    Outpatient Prescriptions Prior to Visit  Medication Sig Dispense Refill  . Multiple Vitamin (MULTIVITAMIN) tablet Take 1 tablet by mouth daily.    . Adalimumab (HUMIRA PEN [place]) Inject 40 mg into the skin. Twice monthly.    . folic acid (FOLVITE) 1 MG tablet Take 1 mg by mouth daily.    Marland Kitchen lactobacillus acidophilus (BACID) TABS tablet Take 1 tablet by mouth once.    . methotrexate (RHEUMATREX) 2.5 MG tablet Take 2.5 mg by mouth once a week. Caution:Chemotherapy. Protect from light.    . metoprolol tartrate (LOPRESSOR) 25 MG tablet 1/2 tablet po twice a day as needed 30 tablet 3  . thyroid (ARMOUR) 180 MG tablet Take 180 mg by mouth daily.     No facility-administered medications prior to visit.     Meds ordered this encounter  Medications  . fluticasone furoate-vilanterol (BREO ELLIPTA) 200-25 MCG/INH AEPB    Sig: Inhale 1 puff into the lungs daily. Rinse, gargle, and spit after use.    Dispense:  60 each    Refill:  5  . montelukast (SINGULAIR) 10 MG tablet    Sig: Take one tablet once daily.    Dispense:  30 tablet    Refill:  5  . albuterol (PROAIR HFA) 108 (90 Base) MCG/ACT inhaler    Sig: Inhale two puffs every four to six hours as needed for cough or wheeze.    Dispense:  1 Inhaler    Refill:  1  . methylPREDNISolone acetate (DEPO-MEDROL) injection 80 mg    Sig:     No Known Allergies  Review of systems negative except as noted in HPI / PMHx or noted below:  ROS  Family History  Problem Relation Age of Onset  . Dementia Mother   . Heart disease Father   . Diabetes Maternal Uncle   . Diabetes Maternal Grandmother   . Dementia Maternal Grandmother   . Stroke Maternal Grandfather   . Heart disease Paternal Grandmother   . Stroke Paternal Grandfather     Social History   Social History  . Marital Status: Married    Spouse Name: N/A  . Number of Children: N/A  . Years of Education: N/A  Occupational History  . Not on file.   Social History Main Topics  . Smoking status: Never Smoker   . Smokeless tobacco: Never Used  . Alcohol Use: No  . Drug Use: No  . Sexual Activity: Not on file   Other Topics Concern  . Not on file   Social History Narrative    Environmental and Social history  Lives in a house with a dry environment, a cat located inside the household for the past year, carpeting in the bedroom, no plastic on the bed or pillow, and no smoking ongoing with inside the household. She works in an office setting.   Objective:   Filed Vitals:   12/25/15 0825  BP: 116/88  Pulse: 80  Temp: 98 F (36.7 C)  Resp: 14   Height: 5' 3.78" (162 cm) Weight: 154 lb 1.6 oz (69.9 kg)  Physical Exam  Constitutional: She is well-developed,  well-nourished, and in no distress.  HENT:  Head: Normocephalic. Head is without right periorbital erythema and without left periorbital erythema.  Right Ear: Tympanic membrane, external ear and ear canal normal.  Left Ear: Tympanic membrane, external ear and ear canal normal.  Nose: Mucosal edema (Obliteration of airway) present. No rhinorrhea.  Mouth/Throat: Oropharynx is clear and moist and mucous membranes are normal. No oropharyngeal exudate.  Eyes: Conjunctivae and lids are normal. Pupils are equal, round, and reactive to light.  Neck: Trachea normal. No tracheal deviation present. No thyromegaly present.  Cardiovascular: Normal rate, regular rhythm, S1 normal, S2 normal and normal heart sounds.   No murmur heard. Pulmonary/Chest: Effort normal. No stridor. No tachypnea. No respiratory distress. She has wheezes (Bilateral expiratory wheezes). She has no rales. She exhibits no tenderness.  Abdominal: Soft. She exhibits no distension and no mass. There is no hepatosplenomegaly. There is no tenderness. There is no rebound and no guarding.  Musculoskeletal: She exhibits no edema or tenderness.  Lymphadenopathy:       Head (right side): No tonsillar adenopathy present.       Head (left side): No tonsillar adenopathy present.    She has no cervical adenopathy.    She has no axillary adenopathy.  Neurological: She is alert. Gait normal.  Skin: No rash noted. She is not diaphoretic. No erythema. No pallor. Nails show no clubbing.  Psychiatric: Mood and affect normal.     Diagnostics: Allergy skin tests were performed. She demonstrated hypersensitivity against house dust mite, and grasses, weeds, and trees on epi cutaneous testing. She demonstrated hypersensitivity to cat and dog and mold on intradermal skin testing.  Spirometry was performed and demonstrated an FEV1 of 2.10 @ 89 % of predicted. Following the administration of nebulized albuterol her FEV1 rose to 2.38 which was an increase in  the FEV1 of 14%  The patient had an Asthma Control Test with the following results:  .     Assessment and Plan:    1. Asthma, not well controlled, moderate persistent, with acute exacerbation   2. Allergic rhinoconjunctivitis   3. Immunosuppressed status (HCC)     1. Allergen avoidance measures  2. Treat and prevent inflammation:   A. Breo 200 one inhalation 1 time per day  B. OTC Rhinocort one spray each nostril one time per day  C. montelukast 10 mg one tablet one time per day  D. Depo-Medrol 80 IM delivered in the clinic today  3. If needed:   A. ProAir HFA 2 puffs every 4-6 hours  B. OTC antihistamine - Claritin/Zyrtec -  one time per day  4. Obtain a chest x-ray  5. Return to clinic in 3 weeks or earlier if problem   Johnisha is very atopic and we'll get her to perform allergen avoidance measures as best as possible and start her on anti-inflammatory medications for her respiratory tract as noted above. To be complete, we will obtain a chest x-ray especially given her history of immunosupression. I will regroup with her in approximately 3 weeks and we'll make a decision about how to proceed pending her response.   Jessica Priest, MD [facility] [place]

## 2015-12-25 NOTE — Patient Instructions (Signed)
  1. Allergen avoidance measures  2. Treat and prevent inflammation:   A. Breo 200 one inhalation 1 time per day  B. OTC Rhinocort one spray each nostril one time per day  C. montelukast 10 mg one tablet one time per day  D. Depo-Medrol 80 IM delivered in the clinic today  3. If needed:   A. ProAir HFA 2 puffs every 4-6 hours  B. OTC antihistamine - Claritin/Zyrtec - one time per day  4. Obtain a chest x-ray  5. Return to clinic in 3 weeks or earlier if problem

## 2015-12-26 ENCOUNTER — Ambulatory Visit
Admission: RE | Admit: 2015-12-26 | Discharge: 2015-12-26 | Disposition: A | Discharge status: Home / Self Care | Payer: 59 | Source: Ambulatory Visit | Attending: Allergy and Immunology | Admitting: Allergy and Immunology

## 2015-12-26 ENCOUNTER — Other Ambulatory Visit: Payer: Self-pay | Admitting: Allergy and Immunology

## 2015-12-26 DIAGNOSIS — R05 Cough: Secondary | ICD-10-CM

## 2015-12-26 DIAGNOSIS — R059 Cough, unspecified: Secondary | ICD-10-CM

## 2016-01-23 ENCOUNTER — Encounter: Payer: Self-pay | Admitting: Allergy and Immunology

## 2016-01-23 ENCOUNTER — Ambulatory Visit (INDEPENDENT_AMBULATORY_CARE_PROVIDER_SITE_OTHER): Payer: 59 | Admitting: Allergy and Immunology

## 2016-01-23 VITALS — BP 128/76 | HR 64 | Resp 16

## 2016-01-23 DIAGNOSIS — J4540 Moderate persistent asthma, uncomplicated: Secondary | ICD-10-CM | POA: Diagnosis not present

## 2016-01-23 DIAGNOSIS — H1010 Acute atopic conjunctivitis, unspecified eye: Secondary | ICD-10-CM

## 2016-01-23 DIAGNOSIS — D849 Immunodeficiency, unspecified: Secondary | ICD-10-CM

## 2016-01-23 DIAGNOSIS — D899 Disorder involving the immune mechanism, unspecified: Secondary | ICD-10-CM | POA: Diagnosis not present

## 2016-01-23 DIAGNOSIS — J309 Allergic rhinitis, unspecified: Secondary | ICD-10-CM

## 2016-01-23 NOTE — Patient Instructions (Signed)
  1. Discontinue montelukast  2. Treat and prevent inflammation:   A. Breo 200 one inhalation 1 time per day  B. OTC Rhinocort one spray each nostril one time per day     3. If needed:   A. ProAir HFA 2 puffs every 4-6 hours  B. OTC antihistamine - Claritin/Zyrtec - one time per day  4. Return to clinic in 10 weeks or earlier if problem

## 2016-01-23 NOTE — Progress Notes (Signed)
Follow-up Note  Referring Provider: Laurann Montana, MD Primary Provider: Cala Bradford, MD Date of Office Visit: 01/23/2016  Subjective:   Kelsey Hart (DOB: January 30, 1951) is a 65 y.o. female who [facility] on 01/23/2016 in re-evaluation of the following:  HPI Comments: Kelsey Hart returns to this clinic in reevaluation of her asthma and allergic rhinoconjunctivitis. Since utilizing medical therapy administered on 12/26/2015 she is resolved all respiratory tract symptoms. She does not have any wheezing or coughing or need to use a short acting bronchodilator and she can breathe through her nose without any difficulty. She might be slightly hoarse since starting the [place] but this does not appear to bother her very much. One issue that has developed since she started medical therapy as she is a little bit more emotional. She feels as though she is on the verge of tears. There was an issue with anxiety prior to starting medications but she thinks that it has been amplified recently. She has had some significant new stressors develop over the course of the past 7-10 days. In fact, this Friday she felt as though she's had a little bit of heaviness on her chest because of anxiety.   Current outpatient prescriptions:  .  Adalimumab (HUMIRA PEN [place]), Inject 40 mg into the skin. Twice monthly., Disp: , Rfl:  .  albuterol (PROAIR HFA) 108 (90 Base) MCG/ACT inhaler, Inhale two puffs every four to six hours as needed for cough or wheeze., Disp: 1 Inhaler, Rfl: 1 .  ARMOUR THYROID 240 MG tablet, TK 1 T PO ON AN EMPTY STOMACH ONCE A DAY FOR 30 DAYS, Disp: , Rfl: 3 .  Cholecalciferol (VITAMIN D PO), Take 1,000 Units by mouth daily., Disp: , Rfl:  .  CRANBERRY PO, Take 8,400 mg by mouth daily., Disp: , Rfl:  .  fluticasone furoate-vilanterol (BREO ELLIPTA) 200-25 MCG/INH AEPB, Inhale 1 puff into the lungs daily. Rinse, gargle, and spit after use., Disp: 60 each, Rfl: 5 .  folic  acid (FOLVITE) 800 MCG tablet, Take 800 mcg by mouth daily., Disp: , Rfl:  .  montelukast (SINGULAIR) 10 MG tablet, Take one tablet once daily., Disp: 30 tablet, Rfl: 5 .  Multiple Vitamin (MULTIVITAMIN) tablet, Take 1 tablet by mouth daily., Disp: , Rfl:  .  Probiotic Product (PROBIOTIC DAILY PO), Take by mouth daily., Disp: , Rfl:  .  S-Adenosylmethionine (SAM-E) 400 MG TABS, Take by mouth daily., Disp: , Rfl:    Past Medical History  Diagnosis Date  . Thyroid disease   . Allergy   . Chronic kidney disease   . SVT (supraventricular tachycardia) (HCC)   . Arthritis     Rheumatoid  . Broken ankle 1980    Past Surgical History  Procedure Laterality Date  . Fracture surgery    . Ankle surgery  1980    No Known Allergies  Review of systems negative except as noted in HPI / PMHx or noted below:  Review of Systems  Constitutional: Negative.   HENT: Negative.   Eyes: Negative.   Respiratory: Negative.   Cardiovascular: Negative.   Gastrointestinal: Negative.   Genitourinary: Negative.   Musculoskeletal: Negative.   Skin: Negative.   Neurological: Negative.   Endo/Heme/Allergies: Negative.   Psychiatric/Behavioral: Negative.      Objective:   Filed Vitals:   01/23/16 1014  BP: 128/76  Pulse: 64  Resp: 16          Physical Exam  Constitutional:  She is well-developed, well-nourished, and in no distress.  HENT:  Head: Normocephalic.  Right Ear: Tympanic membrane, external ear and ear canal normal.  Left Ear: Tympanic membrane, external ear and ear canal normal.  Nose: Nose normal. No mucosal edema or rhinorrhea.  Mouth/Throat: Uvula is midline, oropharynx is clear and moist and mucous membranes are normal. No oropharyngeal exudate.  Eyes: Conjunctivae are normal.  Neck: Trachea normal. No tracheal tenderness present. No tracheal deviation present. No thyromegaly present.  Cardiovascular: Normal rate, regular rhythm, S1 normal, S2 normal and normal heart sounds.    No murmur heard. Pulmonary/Chest: Breath sounds normal. No stridor. No respiratory distress. She has no wheezes. She has no rales.  Musculoskeletal: She exhibits no edema.  Lymphadenopathy:       Head (right side): No tonsillar adenopathy present.       Head (left side): No tonsillar adenopathy present.    She has no cervical adenopathy.    She has no axillary adenopathy.  Neurological: She is alert. Gait normal.  Skin: No rash noted. She is not diaphoretic. No erythema. Nails show no clubbing.  Psychiatric: Mood and affect normal.    Diagnostics:    Spirometry was performed and demonstrated an FEV1 of 2.74 at 116 % of predicted.  The patient had an Asthma Control Test with the following results:  .    Assessment and Plan:   1. Asthma, moderate persistent, well-controlled   2. Allergic rhinoconjunctivitis   3. Immunosuppressed status (HCC)     1. Discontinue montelukast  2. Treat and prevent inflammation:   A. Breo 200 one inhalation 1 time per day  B. OTC Rhinocort one spray each nostril one time per day     3. If needed:   A. ProAir HFA 2 puffs every 4-6 hours  B. OTC antihistamine - Claritin/Zyrtec - one time per day  4. Return to clinic in 10 weeks or earlier if problem  Kelsey Hart has had an excellent response to her medical therapy with resolution of respiratory tract symptoms and a dramatic increase in her spirometry values. Unfortunately, I think she may have developed a side effect from one of the medications and will initially start by eliminating montelukast to see if this results in diminution of her anxious state. Of course it could be the systemic steroid we gave her 3 weeks ago or so that could be contributing to that issue but that should be weaning at this point in time and she actually feels as though she's a little more anxious recently especially with some of the new stressors that have developed in her life. She is also been drinking more coffee lately and  I've asked her to decrease her coffee content as there is no doubt that caffeine may be contributing to her anxious state as well. She will contact me noting her response to discontinue montelukast in regard to both eliminating some of her anxiety and what happens to her respiratory status.   [name], MD [facility]

## 2016-03-11 ENCOUNTER — Emergency Department (HOSPITAL_BASED_OUTPATIENT_CLINIC_OR_DEPARTMENT_OTHER)
Admission: EM | Admit: 2016-03-11 | Discharge: 2016-03-11 | Disposition: A | Discharge status: Home / Self Care | Payer: 59 | Attending: Emergency Medicine | Admitting: Emergency Medicine

## 2016-03-11 ENCOUNTER — Encounter (HOSPITAL_BASED_OUTPATIENT_CLINIC_OR_DEPARTMENT_OTHER): Payer: Self-pay | Admitting: *Deleted

## 2016-03-11 DIAGNOSIS — Y929 Unspecified place or not applicable: Secondary | ICD-10-CM | POA: Diagnosis not present

## 2016-03-11 DIAGNOSIS — I891 Lymphangitis: Secondary | ICD-10-CM | POA: Diagnosis not present

## 2016-03-11 DIAGNOSIS — Z23 Encounter for immunization: Secondary | ICD-10-CM | POA: Diagnosis not present

## 2016-03-11 DIAGNOSIS — S51851A Open bite of right forearm, initial encounter: Secondary | ICD-10-CM | POA: Insufficient documentation

## 2016-03-11 DIAGNOSIS — Y999 Unspecified external cause status: Secondary | ICD-10-CM | POA: Insufficient documentation

## 2016-03-11 DIAGNOSIS — Y939 Activity, unspecified: Secondary | ICD-10-CM | POA: Diagnosis not present

## 2016-03-11 DIAGNOSIS — N189 Chronic kidney disease, unspecified: Secondary | ICD-10-CM | POA: Insufficient documentation

## 2016-03-11 DIAGNOSIS — W5501XA Bitten by cat, initial encounter: Secondary | ICD-10-CM | POA: Insufficient documentation

## 2016-03-11 DIAGNOSIS — M79631 Pain in right forearm: Secondary | ICD-10-CM | POA: Diagnosis present

## 2016-03-11 MED ORDER — TETANUS-DIPHTH-ACELL PERTUSSIS 5-2.5-18.5 LF-MCG/0.5 IM SUSP
0.5000 mL | Freq: Once | INTRAMUSCULAR | Status: AC
  Administered 2016-03-11: 0.5 mL via INTRAMUSCULAR
  Filled 2016-03-11: qty 0.5

## 2016-03-11 MED ORDER — AMOXICILLIN-POT CLAVULANATE 875-125 MG PO TABS
1.0000 | ORAL_TABLET | Freq: Once | ORAL | Status: AC
  Administered 2016-03-11: 1 via ORAL
  Filled 2016-03-11: qty 1

## 2016-03-11 MED ORDER — ACETAMINOPHEN 500 MG PO TABS
1000.0000 mg | ORAL_TABLET | Freq: Once | ORAL | Status: AC
  Administered 2016-03-11: 1000 mg via ORAL
  Filled 2016-03-11: qty 2

## 2016-03-11 MED ORDER — AMOXICILLIN-POT CLAVULANATE 875-125 MG PO TABS: 1.0000 | ORAL_TABLET | Freq: Two times a day (BID) | ORAL | Status: DC

## 2016-03-11 NOTE — ED Notes (Signed)
MD at bedside. 

## 2016-03-11 NOTE — Discharge Instructions (Signed)
Animal Bite Elevate your arm above your heart as much as possible, at least 4 times daily for 30 minutes at a time or more. Return or see your doctor 03/14/2016 for reexam. Take Tylenol for pain. Return sooner if he developed fever vomiting or feel that your conditioning is worsening. Animal bite wounds can get infected. It is important to get proper medical treatment. Ask your doctor if you need rabies treatment. HOME CARE  Wound Care  Follow instructions from your doctor about how to take care of your wound. Make sure you:  Wash your hands with soap and water before you change your bandage (dressing). If you cannot use soap and water, use hand sanitizer.  Change your bandage as told by your doctor.  Leave stitches (sutures), skin glue, or skin tape (adhesive) strips in place. They may need to stay in place for 2 weeks or longer. If tape strips get loose and curl up, you may trim the loose edges. Do not remove tape strips completely unless your doctor says it is okay.  Check your wound every day for signs of infection. Watch for:    Redness, swelling, or pain that gets worse.    Fluid, blood, or pus.  General Instructions  Take or apply over-the-counter and prescription medicines only as told by your doctor.   If you were prescribed an antibiotic, take or apply it as told by your doctor. Do not stop using the antibiotic even if your condition improves.   Keep the injured area raised (elevated) above the level of your heart while you are sitting or lying down.  If directed, apply ice to the injured area.    Put ice in a plastic bag.    Place a towel between your skin and the bag.    Leave the ice on for 20 minutes, 2-3 times per day.   Keep all follow-up visits as told by your doctor. This is important.  GET HELP IF:  You have redness, swelling, or pain that gets worse.   You have a general feeling of sickness (malaise).   You feel sick to your stomach  (nauseous).  You throw up (vomit).   You have pain that does not get better.  GET HELP RIGHT AWAY IF:   You have a red streak going away from your wound.   You have fluid, blood, or pus coming from your wound.   You have a fever or chills.   You have trouble moving your injured area.   You have numbness or tingling anywhere on your body.    This information is not intended to replace advice given to you by your health care provider. Make sure you discuss any questions you have with your health care provider.   Document Released: 10/27/2005 Document Revised: 07/18/2015 Document Reviewed: 03/14/2015 Elsevier Interactive Patient Education [employer]

## 2016-03-11 NOTE — ED Notes (Addendum)
Pt presents from PCP with swelling and redness around R forearm streaking upward. Pt states she was either bitten or scratched by her cat yesterday. Pt c/o pain to the area. Pt states the animal is up to date on all vaccines.

## 2016-03-11 NOTE — ED Notes (Signed)
Pt sent here by PMD for  IV abx for cat bite , redness right forearm

## 2016-03-11 NOTE — ED Provider Notes (Signed)
CSN: [id number]     Arrival date & time 03/11/16  1751 History  By signing my name below, I, Kelsey Hart, attest that this documentation has been prepared under the direction and in the presence of Doug Sou, MD . Electronically Signed: Freida Hart, Scribe. 03/11/2016. 7:16 PM.    Chief Complaint  Patient presents with  . Arm Problem   The history is provided by the patient. No language interpreter was used.     HPI Comments:  Kelsey Hart is a 65 y.o. female who presents to the Emergency Department complaining of swelling and redness to the right forearm with moderate pain to the site. She notes she was clawed by her family cat yesterday ~ 1600; his immunizations are UTD. Pt notes her symptoms have gradually worsened since onset. She has taken ASA with little relief. Uncertain of last tetanus shot. No fever no nausea vomiting. No other associated symptoms. She was not evaluated by a physician earlier today. She attempted to go to her primary care physician, rheumatologist and [facility] but was unable to be seen. Pain is worse with pressing on the area. No other associated symptoms. Pt is currently on Humira for RA. NKDA   Past Medical History  Diagnosis Date  . Thyroid disease   . Allergy   . Chronic kidney disease   . SVT (supraventricular tachycardia) (HCC)   . Arthritis     Rheumatoid  . Broken ankle 1980   Past Surgical History  Procedure Laterality Date  . Fracture surgery    . Ankle surgery  1980   Family History  Problem Relation Age of Onset  . Dementia Mother   . Heart disease Father   . Diabetes Maternal Uncle   . Diabetes Maternal Grandmother   . Dementia Maternal Grandmother   . Stroke Maternal Grandfather   . Heart disease Paternal Grandmother   . Stroke Paternal Grandfather    Social History  Substance Use Topics  . Smoking status: Never Smoker   . Smokeless tobacco: Never Used  . Alcohol Use: No   OB History    No data available      Review of Systems  Constitutional: Negative for fever.  Skin: Positive for color change and wound.       +Right forearm redness  Allergic/Immunologic: Positive for immunocompromised state.       Rheumatoid arthritis  Neurological: Negative for weakness and numbness.  All other systems reviewed and are negative.  Allergies  Review of patient's allergies indicates no known allergies.  Home Medications   Prior to Admission medications   Medication Sig Start Date End Date Taking? Authorizing Provider  Adalimumab (HUMIRA PEN [place]) Inject 40 mg into the skin. Twice monthly.    Historical Provider, MD  albuterol (PROAIR HFA) 108 (90 Base) MCG/ACT inhaler Inhale two puffs every four to six hours as needed for cough or wheeze. 12/25/15   Jessica Priest, MD  ARMOUR THYROID 240 MG tablet TK 1 T PO ON AN EMPTY STOMACH ONCE A DAY FOR 30 DAYS 11/23/15   Historical Provider, MD  Cholecalciferol (VITAMIN D PO) Take 1,000 Units by mouth daily.    Historical Provider, MD  CRANBERRY PO Take 8,400 mg by mouth daily.    Historical Provider, MD  fluticasone furoate-vilanterol (BREO ELLIPTA) 200-25 MCG/INH AEPB Inhale 1 puff into the lungs daily. Rinse, gargle, and spit after use. 12/25/15   Jessica Priest, MD  folic acid (FOLVITE) 800 MCG tablet Take 800 mcg  by mouth daily.    Historical Provider, MD  montelukast (SINGULAIR) 10 MG tablet Take one tablet once daily. 12/25/15   Jessica Priest, MD  Multiple Vitamin (MULTIVITAMIN) tablet Take 1 tablet by mouth daily.    Historical Provider, MD  Probiotic Product (PROBIOTIC DAILY PO) Take by mouth daily.    Historical Provider, MD  S-Adenosylmethionine (Ranie Chinchilla-E) 400 MG TABS Take by mouth daily.    Historical Provider, MD   BP 138/90 mmHg  Pulse 97  Temp(Src) 98.3 F (36.8 C) (Oral)  Resp 18  Ht 5\' 4"  (1.626 m)  Wt 154 lb (69.854 kg)  BMI 26.42 kg/m2  SpO2 98% Physical Exam  Constitutional: She appears well-developed and well-nourished. No distress.  HENT:   Head: Normocephalic and atraumatic.  Eyes: Conjunctivae are normal. Pupils are equal, round, and reactive to light.  Neck: Neck supple. No tracheal deviation present. No thyromegaly present.  Cardiovascular: Normal rate and regular rhythm.   No murmur heard. Pulmonary/Chest: Effort normal and breath sounds normal.  Abdominal: Soft. Bowel sounds are normal. She exhibits no distension. There is no tenderness.  Musculoskeletal: Normal range of motion. She exhibits tenderness. She exhibits no edema.  Minimally tender and reddened and swollen at volar right forearm(see skin exam.) All other extremities without redness or tenderness neurovascular intact  Neurological: She is alert. Coordination normal.  Skin: Skin is warm and dry. No rash noted.  Right upper extremity with reddened area at volar forearm distal third extending to the flexor crease at wrist. There is a red streak proximal to the reddened area which extends 2 cm proximal to the flexor crease of the elbow. No axillary nodes. Radial pulse 2+. All digits with good capillary refill. Otherwise no rash  Psychiatric: She has a normal mood and affect.  Nursing note and vitals reviewed.   ED Course  Procedures   DIAGNOSTIC STUDIES:  Oxygen Saturation is 98% on RA, normal by my interpretation.    COORDINATION OF CARE:  7:15 PM Discussed treatment plan with pt at bedside and pt agreed to plan.   MDM  Plan prescription Augmentin elevation Recheck 2 days Tylenol for pain. Patient declines prescription for pain medicine and states pain is mild presently. Final diagnoses:  None  Diagnosis #1cat Bite to right forearm #2 cellulitis with ascending lymphangitis   I personally performed the services described in this documentation, which was scribed in my presence. The recorded information has been reviewed and considered.    , MD 03/11/16 1925

## 2016-03-12 ENCOUNTER — Telehealth: Payer: Self-pay | Admitting: Allergy and Immunology

## 2016-03-12 NOTE — Telephone Encounter (Signed)
Informed patient to continue Breo.

## 2016-03-12 NOTE — Telephone Encounter (Signed)
She is being treated for an infection with Amoxacillin and she wants to know if she should continue to use the [facility]. Can you please give her a call back to advise.

## 2016-03-14 ENCOUNTER — Ambulatory Visit (INDEPENDENT_AMBULATORY_CARE_PROVIDER_SITE_OTHER): Payer: 59 | Admitting: Family Medicine

## 2016-03-14 VITALS — BP 122/72 | HR 98 | Temp 98.0°F | Resp 17 | Ht 64.0 in | Wt 153.0 lb

## 2016-03-14 DIAGNOSIS — L03113 Cellulitis of right upper limb: Secondary | ICD-10-CM

## 2016-03-14 NOTE — Progress Notes (Signed)
Kelsey Hart is a 65 y.o. female who presents to Urgent Care today for lymphadenitis and cellulitis. Patient was bitten by her cat earlier this week and was seen in the emergency department on May 2. She was thought to have Bite related skin infection and was given a tetanus vaccine, and Augmentin antibiotics. She notes she is improving and feels well with minimal to no pain. No fevers or chills.   Past Medical History  Diagnosis Date  . Thyroid disease   . Allergy   . Chronic kidney disease   . SVT (supraventricular tachycardia) (HCC)   . Arthritis     Rheumatoid  . Broken ankle 1980   Past Surgical History  Procedure Laterality Date  . Fracture surgery    . Ankle surgery  1980   Social History  Substance Use Topics  . Smoking status: Never Smoker   . Smokeless tobacco: Never Used  . Alcohol Use: No   ROS as above Medications: Current Outpatient Prescriptions  Medication Sig Dispense Refill  . Adalimumab (HUMIRA PEN [place]) Inject 40 mg into the skin. Twice monthly.    Marland Kitchen amoxicillin-clavulanate (AUGMENTIN) 875-125 MG tablet Take 1 tablet by mouth 2 (two) times daily. One po bid x 10 days 20 tablet 0  . ARMOUR THYROID 240 MG tablet TK 1 T PO ON AN EMPTY STOMACH ONCE A DAY FOR 30 DAYS  3  . Cholecalciferol (VITAMIN D PO) Take 1,000 Units by mouth daily.    Marland Kitchen CRANBERRY PO Take 8,400 mg by mouth daily.    . fluticasone furoate-vilanterol (BREO ELLIPTA) 200-25 MCG/INH AEPB Inhale 1 puff into the lungs daily. Rinse, gargle, and spit after use. 60 each 5  . folic acid (FOLVITE) 800 MCG tablet Take 800 mcg by mouth daily.    . Multiple Vitamin (MULTIVITAMIN) tablet Take 1 tablet by mouth daily.    . Probiotic Product (PROBIOTIC DAILY PO) Take by mouth daily.    . S-Adenosylmethionine (SAM-E) 400 MG TABS Take by mouth daily.    Marland Kitchen albuterol (PROAIR HFA) 108 (90 Base) MCG/ACT inhaler Inhale two puffs every four to six hours as needed for cough or wheeze. (Patient not taking: Reported on  03/14/2016) 1 Inhaler 1  . montelukast (SINGULAIR) 10 MG tablet Take one tablet once daily. (Patient not taking: Reported on 03/14/2016) 30 tablet 5   No current facility-administered medications for this visit.   No Known Allergies   Exam:  BP 122/72 mmHg  Pulse 98  Temp(Src) 98 F (36.7 C) (Oral)  Resp 17  Ht 5\' 4"  (1.626 m)  Wt 153 lb (69.4 kg)  BMI 26.25 kg/m2  SpO2 98% Gen: Well NAD Skin: Erythematous nonindurated nonfluctuant right forearm volar aspect. Wrist and hand motion are normal without pain. Skin is nontender.   No results found for this or any previous visit (from the past 24 hour(s)). No results found.  Assessment and Plan: 65 y.o. female with resolving cellulitis and lymphadenitis due to cat bite. Continue antibiotics. Return as needed.  Discussed warning signs or symptoms. Please see discharge instructions. Patient expresses understanding.

## 2016-03-14 NOTE — Patient Instructions (Addendum)
  Thank you for coming in today. I think the infection is improving.  Return as needed.     IF you received an x-ray today, you will receive an invoice from [facility]. Please contact [facility] at [phone] with questions or concerns regarding your invoice.   IF you received labwork today, you will receive an invoice from [employer]. Please contact [facility] at [phone] with questions or concerns regarding your invoice.   Our billing staff will not be able to assist you with questions regarding bills from these companies.  You will be contacted with the lab results as soon as they are available. The fastest way to get your results is to activate your My Chart account. Instructions are located on the last page of this paperwork. If you have not heard from [place] regarding the results in 2 weeks, please contact this office.

## 2016-08-05 ENCOUNTER — Telehealth: Payer: Self-pay | Admitting: Allergy and Immunology

## 2016-08-05 ENCOUNTER — Other Ambulatory Visit: Payer: Self-pay

## 2016-08-05 MED ORDER — FLUTICASONE FUROATE-VILANTEROL 200-25 MCG/INH IN AEPB: 1.0000 | INHALATION_SPRAY | Freq: Every day | RESPIRATORY_TRACT | 0 refills | Status: DC

## 2016-08-05 NOTE — Telephone Encounter (Signed)
Sent in 1 refill 

## 2016-08-05 NOTE — Telephone Encounter (Signed)
Patient called and made an appt with Dr. Lucie Leather for 08/20/16, but states she will be out of her Breo before then. Would like to know if you can call her some before her appt. [facility] on [address].

## 2016-08-20 ENCOUNTER — Encounter (INDEPENDENT_AMBULATORY_CARE_PROVIDER_SITE_OTHER): Payer: Self-pay

## 2016-08-20 ENCOUNTER — Encounter: Payer: Self-pay | Admitting: Allergy and Immunology

## 2016-08-20 ENCOUNTER — Ambulatory Visit (INDEPENDENT_AMBULATORY_CARE_PROVIDER_SITE_OTHER): Payer: 59 | Admitting: Allergy and Immunology

## 2016-08-20 VITALS — BP 120/70 | HR 80 | Resp 18

## 2016-08-20 DIAGNOSIS — J4540 Moderate persistent asthma, uncomplicated: Secondary | ICD-10-CM

## 2016-08-20 DIAGNOSIS — D849 Immunodeficiency, unspecified: Secondary | ICD-10-CM

## 2016-08-20 DIAGNOSIS — J3089 Other allergic rhinitis: Secondary | ICD-10-CM

## 2016-08-20 DIAGNOSIS — D899 Disorder involving the immune mechanism, unspecified: Secondary | ICD-10-CM | POA: Diagnosis not present

## 2016-08-20 MED ORDER — FLUTICASONE FUROATE-VILANTEROL 100-25 MCG/INH IN AEPB: INHALATION_SPRAY | RESPIRATORY_TRACT | 5 refills | Status: DC

## 2016-08-20 NOTE — Patient Instructions (Signed)
  1. Continue to Treat and prevent inflammation:   A. Decrease Breo to Breo 100 one inhalation 1 time per day. sample  B. OTC Rhinocort one spray each nostril one time per day if needed     3. If needed:   A. ProAir HFA 2 puffs every 4-6 hours  B. OTC antihistamine - Claritin/Zyrtec - one time per day  4. Return to clinic in February 2018 or earlier if problem

## 2016-08-20 NOTE — Progress Notes (Signed)
Follow-up Note  Referring Provider: Laurann Montana, MD Primary Provider: Cala Bradford, MD Date of Office Visit: 08/20/2016  Subjective:   Kelsey Hart (DOB: 1951/03/02) is a 65 y.o. female who [facility] on 08/20/2016 in re-evaluation of the following:  HPI: Kelsey Hart returns to this clinic in reevaluation of her asthma and allergic rhinoconjunctivitis. I last saw her in his clinic in March 2017  She has not had an exacerbation of her asthma and has not required a systemic steroid to treat an exacerbation while consistently using her Breo 200 one time per day. Rarely does she use a short-acting bronchodilator and she can exercise without any problem.  She has noticed that her voice has become somewhat squeaky and she will sometimes develop a low-grade intermittent laryngitis since using her Breo.  She has not had to use an antibiotic to treat an episode of sinusitis. She's had very little nasal issues and she does not use Rhinocort at this point in time.  [name] has already received the flu vaccine for this year. She is scheduled for a pneumonia vaccine in November.  Interestingly, the spring she was bit by her cat and developed a rather significant cellulitis that required successful treatment with amoxicillin.  She continues on Humira for her rheumatoid arthritis.    Medication List      albuterol 108 (90 Base) MCG/ACT inhaler Commonly known as:  PROAIR HFA Inhale two puffs every four to six hours as needed for cough or wheeze.   ARMOUR THYROID 240 MG tablet Generic drug:  thyroid TK 1 T PO ON AN EMPTY STOMACH ONCE A DAY FOR 30 DAYS   CRANBERRY PO Take 8,400 mg by mouth daily.   fluticasone furoate-vilanterol 200-25 MCG/INH Aepb Commonly known as:  BREO ELLIPTA Inhale 1 puff into the lungs daily. Rinse, gargle, and spit after use.   folic acid 800 MCG tablet Commonly known as:  FOLVITE Take 800 mcg by mouth daily.   HUMIRA PEN  [place] Inject 40 mg into the skin. Twice monthly.   multivitamin tablet Take 1 tablet by mouth daily.   PROBIOTIC DAILY PO Take by mouth daily.   SAM-e 400 MG Tabs Take by mouth daily.   VITAMIN D PO Take 1,000 Units by mouth daily.       Past Medical History:  Diagnosis Date  . Allergy   . Arthritis    Rheumatoid  . Broken ankle 1980  . Chronic kidney disease   . SVT (supraventricular tachycardia) (HCC)   . Thyroid disease     Past Surgical History:  Procedure Laterality Date  . ANKLE SURGERY  1980  . FRACTURE SURGERY      No Known Allergies  Review of systems negative except as noted in HPI / PMHx or noted below:  Review of Systems  Constitutional: Negative.   HENT: Negative.   Eyes: Negative.   Respiratory: Negative.   Cardiovascular: Negative.   Gastrointestinal: Negative.   Genitourinary: Negative.   Musculoskeletal: Negative.   Skin: Negative.   Neurological: Negative.   Endo/Heme/Allergies: Negative.   Psychiatric/Behavioral: Negative.      Objective:   Vitals:   08/20/16 1059  BP: 120/70  Pulse: 80  Resp: 18          Physical Exam  Constitutional: She is well-developed, well-nourished, and in no distress.  HENT:  Head: Normocephalic.  Right Ear: Tympanic membrane, external ear and ear canal normal.  Left Ear: Tympanic membrane,  external ear and ear canal normal.  Nose: Nose normal. No mucosal edema or rhinorrhea.  Mouth/Throat: Uvula is midline, oropharynx is clear and moist and mucous membranes are normal. No oropharyngeal exudate.  Eyes: Conjunctivae are normal.  Neck: Trachea normal. No tracheal tenderness present. No tracheal deviation present. No thyromegaly present.  Cardiovascular: Normal rate, regular rhythm, S1 normal, S2 normal and normal heart sounds.   No murmur heard. Pulmonary/Chest: Breath sounds normal. No stridor. No respiratory distress. She has no wheezes. She has no rales.  Musculoskeletal: She exhibits no edema.   Lymphadenopathy:       Head (right side): No tonsillar adenopathy present.       Head (left side): No tonsillar adenopathy present.    She has no cervical adenopathy.  Neurological: She is alert. Gait normal.  Skin: No rash noted. She is not diaphoretic. No erythema. Nails show no clubbing.  Psychiatric: Mood and affect normal.    Diagnostics:    Spirometry was performed and demonstrated an FEV1 of 2.62 at 110 % of predicted.  The patient had an Asthma Control Test with the following results: ACT Total Score: 22.    Assessment and Plan:   1. Asthma, moderate persistent, well-controlled   2. Other allergic rhinitis   3. Immunosuppressed status (HCC)     1. Continue to Treat and prevent inflammation:   A. Decrease Breo to Breo 100 one inhalation 1 time per day. sample  B. OTC Rhinocort one spray each nostril one time per day if needed     3. If needed:   A. ProAir HFA 2 puffs every 4-6 hours  B. OTC antihistamine - Claritin/Zyrtec - one time per day  4. Return to clinic in February 2018 or earlier if problem  [name] appears to be doing very well and we will now attempt to decrease her dose of inhaled steroid as noted above. Hopefully this will result in a decrease of her throat issues. Certainly if the throat issues continue we may need to have an ENT doctor take a look at her laryngeal structure. She will contact me in a month if this throat issue continues. I will see her back in this clinic in February 2018 or earlier if there is a problem.  [name], MD [facility]

## 2016-11-04 ENCOUNTER — Encounter: Payer: Self-pay | Admitting: Physician Assistant

## 2016-11-04 ENCOUNTER — Ambulatory Visit (INDEPENDENT_AMBULATORY_CARE_PROVIDER_SITE_OTHER): Payer: PRIVATE HEALTH INSURANCE | Admitting: Physician Assistant

## 2016-11-04 VITALS — BP 133/73 | HR 68 | Temp 97.7°F | Resp 16 | Ht 64.0 in | Wt 155.0 lb

## 2016-11-04 DIAGNOSIS — Z0000 Encounter for general adult medical examination without abnormal findings: Secondary | ICD-10-CM

## 2016-11-04 DIAGNOSIS — Z1389 Encounter for screening for other disorder: Secondary | ICD-10-CM

## 2016-11-04 DIAGNOSIS — R0981 Nasal congestion: Secondary | ICD-10-CM | POA: Diagnosis not present

## 2016-11-04 DIAGNOSIS — Z114 Encounter for screening for human immunodeficiency virus [HIV]: Secondary | ICD-10-CM

## 2016-11-04 DIAGNOSIS — Z1159 Encounter for screening for other viral diseases: Secondary | ICD-10-CM | POA: Diagnosis not present

## 2016-11-04 LAB — POCT URINALYSIS DIP (MANUAL ENTRY)
Bilirubin, UA: NEGATIVE
GLUCOSE UA: NEGATIVE
Ketones, POC UA: NEGATIVE
Leukocytes, UA: NEGATIVE
NITRITE UA: NEGATIVE
PROTEIN UA: NEGATIVE
RBC UA: NEGATIVE
Spec Grav, UA: 1.02
UROBILINOGEN UA: 0.2
pH, UA: 5

## 2016-11-04 MED ORDER — FLUTICASONE PROPIONATE 50 MCG/ACT NA SUSP: 2.0000 | Freq: Every day | NASAL | 12 refills | Status: DC

## 2016-11-04 NOTE — Patient Instructions (Addendum)
The Flonase should help with the chronic nasal congestion, but it may take up to 2 weeks to provide significant benefit. If it's not adequate, let me or Dr. Cliffton Asters know, and we can consider the addition of azelastine nasal spray.  THE 3 SIMPLE RULES FOR NASAL SPRAY USE: 1. Don't snort. 2. Look at your toes and spray up your nose. 3. Use the opposite hand to spray in both nostrils.     IF you received an x-ray today, you will receive an invoice from [facility]. Please contact [facility] at [phone] with questions or concerns regarding your invoice.   IF you received labwork today, you will receive an invoice from [place]. Please contact [facility] at [phone] with questions or concerns regarding your invoice.   Our billing staff will not be able to assist you with questions regarding bills from these companies.  You will be contacted with the lab results as soon as they are available. The fastest way to get your results is to activate your My Chart account. Instructions are located on the last page of this paperwork. If you have not heard from [place] regarding the results in 2 weeks, please contact this office.     Keeping You Healthy  Get These Tests  Blood Pressure- Have your blood pressure checked by your healthcare provider at least once a year.  Normal blood pressure is 120/80.  Weight- Have your body mass index (BMI) calculated to screen for obesity.  BMI is a measure of body fat based on height and weight.  You can calculate your own BMI at https://www.west-esparza.com/  Cholesterol- Have your cholesterol checked every year.  Diabetes- Have your blood sugar checked every year if you have high blood pressure, high cholesterol, a family history of diabetes or if you are overweight.  Pap Test - Have a pap test every 1 to 5 years if you have been sexually active.  If you are older than 65 and recent pap tests have been normal you may not need additional pap tests.   In addition, if you have had a hysterectomy  for benign disease additional pap tests are not necessary.  Mammogram-Yearly mammograms are essential for early detection of breast cancer  Screening for Colon Cancer- Colonoscopy starting at age 86. Screening may begin sooner depending on your family history and other health conditions.  Follow up colonoscopy as directed by your Gastroenterologist.  Screening for Osteoporosis- Screening begins at age 8 with bone density scanning, sooner if you are at higher risk for developing Osteoporosis.  Get these medicines  Calcium with Vitamin D- Your body requires 1200-1500 mg of Calcium a day and [phone] IU of Vitamin D a day.  You can only absorb 500 mg of Calcium at a time therefore Calcium must be taken in 2 or 3 separate doses throughout the day.  Hormones- Hormone therapy has been associated with increased risk for certain cancers and heart disease.  Talk to your healthcare provider about if you need relief from menopausal symptoms.  Aspirin- Ask your healthcare provider about taking Aspirin to prevent Heart Disease and Stroke.  Get these Immuniztions  Flu shot- Every fall  Pneumonia shot- Once after the age of 61; if you are younger ask your healthcare provider if you need a pneumonia shot.  Tetanus- Every ten years.  Zostavax- Once after the age of 80 to prevent shingles.  Take these steps  Don't smoke- Your healthcare provider can help you quit. For tips on how to quit,  ask your healthcare provider or go to www.smokefree.gov or call 1-800 QUIT-NOW.  Be physically active- Exercise 5 days a week for a minimum of 30 minutes.  If you are not already physically active, start slow and gradually work up to 30 minutes of moderate physical activity.  Try walking, dancing, bike riding, swimming, etc.  Eat a healthy diet- Eat a variety of healthy foods such as fruits, vegetables, whole grains, low fat milk, low fat cheeses, yogurt, lean meats,  chicken, fish, eggs, dried beans, tofu, etc.  For more information go to www.thenutritionsource.org  Dental visit- Brush and floss teeth twice daily; visit your dentist twice a year.  Eye exam- Visit your Optometrist or Ophthalmologist yearly.  Drink alcohol in moderation- Limit alcohol intake to one drink or less a day.  Never drink and drive.  Depression- Your emotional health is as important as your physical health.  If you're feeling down or losing interest in things you normally enjoy, please talk to your healthcare provider.  Seat Belts- can save your life; always wear one  Smoke/Carbon Monoxide detectors- These detectors need to be installed on the appropriate level of your home.  Replace batteries at least once a year.  Violence- If anyone is threatening or hurting you, please tell your healthcare provider.  Living Will/ Health care power of attorney- Discuss with your healthcare provider and family.

## 2016-11-04 NOTE — Progress Notes (Signed)
Patient ID: Kelsey Hart, female    DOB: April 17, 1951, 65 y.o.   MRN: [id number]  PCP: Kelsey Bradford, MD  Chief Complaint  Patient presents with  . Annual Exam    Subjective:   Presents for Kelsey Hart.  Couldn't get in with Dr. Cliffton Hart for this exam (in October, the next available was 02/2017). Needs this visit documented for the wellness benefit at work.  Cervical Cancer Screening: 05/2015. Normal pathology. NEGATIVE HPV. No history of abnormal. Repeat in 2021. Breast Cancer Screening: 08/2016 at [facility], reportedly normal Colorectal Cancer Screening: not yet. Doesn't trust her son or husband to drive her home following the procedure. Bone Density Testing: 2015, doesn't recall specifics, may have had some bone loss in the hips. HIV Screening: will do today STI Screening: very low, not currently sexually active Seasonal Influenza Vaccination: 08/2016 Td/Tdap Vaccination: 03/2016 Pneumococcal Vaccination: had Pneumovax in 2014? Zoster Vaccination: not yet. Frequency of Dental evaluation: Q6 months Frequency of Eye evaluation: annually    Patient Active Problem List   Diagnosis Date Noted  . Thyroid disease   . Allergy   . Chronic kidney disease   . SVT (supraventricular tachycardia) (HCC)   . Rheumatoid arthritis (HCC) 08/30/2013    Past Medical History:  Diagnosis Date  . Allergy   . Arthritis    Rheumatoid  . Broken ankle 1980  . Chronic kidney disease   . SVT (supraventricular tachycardia) (HCC)   . Thyroid disease      Prior to Admission medications   Medication Sig Start Date End Date Taking? Authorizing Provider  Adalimumab (HUMIRA PEN [place]) Inject 40 mg into the skin. Twice monthly.   Yes Historical Provider, MD  albuterol (PROAIR HFA) 108 (90 Base) MCG/ACT inhaler Inhale two puffs every four to six hours as needed for cough or wheeze. 12/25/15  Yes Kelsey Priest, MD  ARMOUR THYROID 240 MG tablet TK 1 T PO ON AN EMPTY STOMACH ONCE A DAY FOR 30 DAYS  11/23/15  Yes Historical Provider, MD  Cholecalciferol (VITAMIN D PO) Take 1,000 Units by mouth daily.   Yes Historical Provider, MD  CRANBERRY PO Take 8,400 mg by mouth daily.   Yes Historical Provider, MD  fluticasone furoate-vilanterol (BREO ELLIPTA) 100-25 MCG/INH AEPB Inhale one puff once daily as directed 08/20/16  Yes Kelsey Priest, MD  folic acid (FOLVITE) 800 MCG tablet Take 800 mcg by mouth daily.   Yes Historical Provider, MD  Multiple Vitamin (MULTIVITAMIN) tablet Take 1 tablet by mouth daily.   Yes Historical Provider, MD  Probiotic Product (PROBIOTIC DAILY PO) Take by mouth daily.   Yes Historical Provider, MD  S-Adenosylmethionine (SAM-E) 400 MG TABS Take by mouth daily.   Yes Historical Provider, MD    No Known Allergies  Past Surgical History:  Procedure Laterality Date  . ANKLE SURGERY  1980  . FRACTURE SURGERY      Family History  Problem Relation Age of Onset  . Dementia Mother   . Heart disease Father   . Diabetes Maternal Grandmother   . Dementia Maternal Grandmother   . Stroke Maternal Grandfather   . Heart disease Paternal Grandmother   . Stroke Paternal Grandfather   . Diabetes Maternal Uncle     Social History   Social History  . Marital status: Married    Spouse name: Kelsey Hart  . Number of children: 0  . Years of education: college   Occupational History  . ADMINISTRATIVE ASSISTANT [name]  Social History Main Topics  . Smoking status: Never Smoker  . Smokeless tobacco: Never Used  . Alcohol use No  . Drug use: No  . Sexual activity: No   Other Topics Concern  . None   Social History Narrative   Lives with her husband.   Adopted son lives with them, due to unemployment. "He is a Nurse, learning disability" and doesn't believe that he needs to work." "he is a pot-head." History of violence, but not since adolescence.   Husband has chronic pain, seeing a specialist. Lot's of pharmaceuticals. Not violent, but they have nearly no  relationship.       Review of Systems  Constitutional: Negative.   HENT: Positive for congestion, sneezing, tinnitus (RIGHT) and voice change (medication related). Negative for trouble swallowing.   Eyes: Negative.   Respiratory: Positive for chest tightness ("stress related? Feels when she takes deep breaths). Negative for apnea, cough, choking, shortness of breath, wheezing and stridor.   Cardiovascular: Positive for leg swelling (mild). Negative for chest pain and palpitations.  Gastrointestinal: Negative.   Endocrine: Negative.   Genitourinary: Negative.   Musculoskeletal: Negative.   Skin: Negative.   Allergic/Immunologic: Positive for environmental allergies and immunocompromised state. Negative for food allergies.  Neurological: Negative.   Hematological: Negative.   Psychiatric/Behavioral: Positive for decreased concentration and dysphoric mood.        Objective:  Physical Exam  Constitutional: She is oriented to person, place, and time. Vital signs are normal. She appears well-developed and well-nourished. She is active and cooperative. No distress.  BP 133/73 (BP Location: Right Arm, Patient Position: Sitting, Cuff Size: Small)   Pulse 68   Temp 97.7 F (36.5 C) (Oral)   Resp 16   Ht 5\' 4"  (1.626 m)   Wt 155 lb (70.3 kg)   SpO2 99%   BMI 26.61 kg/m    HENT:  Head: Normocephalic and atraumatic.  Right Ear: Hearing, tympanic membrane, external ear and ear canal normal. No foreign bodies.  Left Ear: Hearing, tympanic membrane, external ear and ear canal normal. No foreign bodies.  Nose: Nose normal.  Mouth/Throat: Uvula is midline, oropharynx is clear and moist and mucous membranes are normal. No oral lesions. Normal dentition. No dental abscesses or uvula swelling. No oropharyngeal exudate.  Eyes: Conjunctivae, EOM and lids are normal. Pupils are equal, round, and reactive to light. Right eye exhibits no discharge. Left eye exhibits no discharge. No scleral  icterus.  Fundoscopic exam:      The right eye shows no arteriolar narrowing, no AV nicking, no exudate, no hemorrhage and no papilledema. The right eye shows red reflex.       The left eye shows no arteriolar narrowing, no AV nicking, no exudate, no hemorrhage and no papilledema. The left eye shows red reflex.  Neck: Trachea normal, normal range of motion and full passive range of motion without pain. Neck supple. No spinous process tenderness and no muscular tenderness present. No thyroid mass and no thyromegaly present.  Cardiovascular: Normal rate, regular rhythm, normal heart sounds, intact distal pulses and normal pulses.   Pulmonary/Chest: Effort normal and breath sounds normal. Right breast exhibits no inverted nipple, no mass, no nipple discharge, no skin change and no tenderness. Left breast exhibits no inverted nipple, no mass, no nipple discharge, no skin change and no tenderness. Breasts are symmetrical.  Abdominal: Soft. Bowel sounds are normal. She exhibits no mass. There is no tenderness.  Musculoskeletal: She exhibits no edema or tenderness.  Cervical back: Normal.       Thoracic back: Normal.       Lumbar back: Normal.  Lymphadenopathy:       Head (right side): No tonsillar, no preauricular, no posterior auricular and no occipital adenopathy present.       Head (left side): No tonsillar, no preauricular, no posterior auricular and no occipital adenopathy present.    She has no cervical adenopathy.       Right: No supraclavicular adenopathy present.       Left: No supraclavicular adenopathy present.  Neurological: She is alert and oriented to person, place, and time. She has normal strength and normal reflexes. No cranial nerve deficit. She exhibits normal muscle tone. Coordination and gait normal.  Skin: Skin is warm, dry and intact. No rash noted. She is not diaphoretic. No cyanosis or erythema. Nails show no clubbing.  Psychiatric: She has a normal mood and affect. Her  speech is normal and behavior is normal. Judgment and thought content normal.           Assessment & Plan:  1. Annual physical exam Age appropriate anticipatory guidance provided.  2. Need for hepatitis C screening test - Hepatitis C antibody  3. Screening for HIV (human immunodeficiency virus) - HIV antibody  4. Screening for blood or protein in urine - POCT urinalysis dipstick - Urine Microscopic  5. Nasal congestion Trial of Flonase. If incomplete relief, would add azelastine PRN. - fluticasone (FLONASE) 50 MCG/ACT nasal spray; Place 2 sprays into both nostrils daily.  Dispense: 16 g; Refill: 12   Fernande Bras, PA-C Physician Assistant-Certified [facility]

## 2016-11-05 LAB — URINALYSIS, MICROSCOPIC ONLY: CASTS: NONE SEEN /LPF

## 2016-11-05 LAB — HIV ANTIBODY (ROUTINE TESTING W REFLEX): HIV Screen 4th Generation wRfx: NONREACTIVE

## 2016-11-05 LAB — HEPATITIS C ANTIBODY

## 2016-11-06 ENCOUNTER — Encounter: Payer: Self-pay | Admitting: Physician Assistant

## 2017-03-13 ENCOUNTER — Other Ambulatory Visit: Payer: Self-pay | Admitting: Allergy and Immunology

## 2017-04-15 ENCOUNTER — Other Ambulatory Visit: Payer: Self-pay | Admitting: Allergy and Immunology

## 2017-04-17 ENCOUNTER — Telehealth: Payer: Self-pay | Admitting: Allergy and Immunology

## 2017-04-17 ENCOUNTER — Other Ambulatory Visit: Payer: Self-pay | Admitting: *Deleted

## 2017-04-17 MED ORDER — FLUTICASONE FUROATE-VILANTEROL 100-25 MCG/INH IN AEPB: INHALATION_SPRAY | RESPIRATORY_TRACT | 0 refills | Status: DC

## 2017-04-17 NOTE — Telephone Encounter (Signed)
Patient last seen 08-20-16 by Dr. Lucie Leather. She called an made an appt for 05-12-17. She would like her Breo refilled. She only has 10 days left on it. [facility] [address].

## 2017-04-17 NOTE — Telephone Encounter (Signed)
Rx sent 

## 2017-04-29 ENCOUNTER — Encounter (HOSPITAL_COMMUNITY): Payer: Self-pay

## 2017-04-29 ENCOUNTER — Ambulatory Visit (INDEPENDENT_AMBULATORY_CARE_PROVIDER_SITE_OTHER): Payer: PRIVATE HEALTH INSURANCE | Admitting: Family Medicine

## 2017-04-29 VITALS — BP 154/66 | HR 69 | Temp 98.1°F | Resp 16 | Ht 64.0 in | Wt 161.4 lb

## 2017-04-29 DIAGNOSIS — L0390 Cellulitis, unspecified: Secondary | ICD-10-CM | POA: Diagnosis not present

## 2017-04-29 DIAGNOSIS — R600 Localized edema: Secondary | ICD-10-CM | POA: Diagnosis not present

## 2017-04-29 MED ORDER — SULFAMETHOXAZOLE-TRIMETHOPRIM 800-160 MG PO TABS: 1.0000 | ORAL_TABLET | Freq: Two times a day (BID) | ORAL | 0 refills | Status: DC

## 2017-04-29 NOTE — Progress Notes (Signed)
Kelsey Hart is a 66 y.o. female who presents to Primary Care at [facility] today for LE edema:   1.  LE edema:  Patient 5 days worth of right leg swelling and redness. Patient states that she noted redness and swelling over the weekend.  She presented to an outside minute clinic and was diagnosed with cellulitis and started on Keflex 3 times a day. This was on Monday.  She feels that since Monday noted redness has been about the same but the swelling has gotten a little worse.  Redness is on the tibial aspect of her right calf.  It is "tingling" in symptoms with pain with direct palpation. Swelling is better and her ankles. She seen no red streaks going up her leg. No fevers or chills. No nausea vomiting. She is eating and drinking well. She has felt "warmer than usual."   No calf tenderness.  No dyspnea.  No recent long car rides/travel.    ROS as above.     PMH reviewed. Patient is a nonsmoker.   Past Medical History:  Diagnosis Date  . Allergy   . Arthritis    Rheumatoid  . Broken ankle 1980  . Chronic kidney disease   . SVT (supraventricular tachycardia) (HCC)   . Thyroid disease    Past Surgical History:  Procedure Laterality Date  . ANKLE SURGERY  1980  . FRACTURE SURGERY      Medications reviewed. Current Outpatient Prescriptions  Medication Sig Dispense Refill  . albuterol (PROAIR HFA) 108 (90 Base) MCG/ACT inhaler Inhale two puffs every four to six hours as needed for cough or wheeze. 1 Inhaler 1  . ARMOUR THYROID 240 MG tablet TK 1 T PO ON AN EMPTY STOMACH ONCE A DAY FOR 30 DAYS  3  . cephALEXin (KEFLEX) 500 MG capsule Take 500 mg by mouth 3 (three) times daily.    . Cholecalciferol (VITAMIN D PO) Take 1,000 Units by mouth daily.    Marland Kitchen CRANBERRY PO Take 8,400 mg by mouth daily.    . fluticasone furoate-vilanterol (BREO ELLIPTA) 100-25 MCG/INH AEPB Inhale one puff once daily to prevent cough or wheeze 60 each 0  . folic acid (FOLVITE) 800 MCG tablet Take 800 mcg by  mouth daily.    . Multiple Vitamin (MULTIVITAMIN) tablet Take 1 tablet by mouth daily.    . Probiotic Product (PROBIOTIC DAILY PO) Take by mouth daily.    . S-Adenosylmethionine (SAM-E) 400 MG TABS Take by mouth daily.    . Adalimumab (HUMIRA PEN [place]) Inject 40 mg into the skin. Twice monthly.    . fluticasone (FLONASE) 50 MCG/ACT nasal spray Place 2 sprays into both nostrils daily. (Patient not taking: Reported on 04/29/2017) 16 g 12  . sulfamethoxazole-trimethoprim (BACTRIM DS,SEPTRA DS) 800-160 MG tablet Take 1 tablet by mouth 2 (two) times daily. 14 tablet 0   No current facility-administered medications for this visit.      Physical Exam:  BP (!) 154/66   Pulse 69   Temp 98.1 F (36.7 C) (Oral)   Resp 16   Ht 5\' 4"  (1.626 m)   Wt 161 lb 6.4 oz (73.2 kg)   SpO2 97%   BMI 27.70 kg/m  Gen:  Alert, cooperative patient who appears stated age in no acute distress.  Vital signs reviewed. HEENT: EOMI,  MMM Ext:  Left leg with +1 edema circumferential around ankle.  No swelling rest of leg.   - Right leg:  10 cm roughly circular area  of erythema on the anterior aspect of calf.  No redness/lymphangitis that extends up her calf/leg.  No redness/warmth of calf itself.  Calf is nontender.  Strength is 5/5 dorsal and plantar flexion without pain/tenderness.  +2 edema circumferentially around ankles extending down to dorsum of foot.     Assessment and Plan:  1.  LE edema: - still think this is cellulitis.  Has more of a strep appearance than staph - however with no improvement with keflex, broadening to bactrim.   - Low risk for DVT -- but with worsening edema, plan doppler RLE to ensure no clot.   - I will call with results of doppler.

## 2017-04-29 NOTE — Patient Instructions (Addendum)
It was good to see you today.  I do think this is a skin infection, otherwise known as cellulitis.    Take the Bactrim twice daily for 7 days to help clear this up.  If you notice any increasing redness despite the antibiotic, any red streaks going up your leg, nausea and vomiting, fevers or chills, come back immediately.    I will call with the results of your leg ultrasound.  We are looking for a blood clot.      IF you received an x-ray today, you will receive an invoice from [facility]. Please contact [facility] at [phone] with questions or concerns regarding your invoice.   IF you received labwork today, you will receive an invoice from [place]. Please contact [facility] at [phone] with questions or concerns regarding your invoice.   Our billing staff will not be able to assist you with questions regarding bills from these companies.  You will be contacted with the lab results as soon as they are available. The fastest way to get your results is to activate your My Chart account. Instructions are located on the last page of this paperwork. If you have not heard from [place] regarding the results in 2 weeks, please contact this office.

## 2017-04-30 ENCOUNTER — Ambulatory Visit (HOSPITAL_COMMUNITY)
Admission: RE | Admit: 2017-04-30 | Discharge: 2017-04-30 | Disposition: A | Discharge status: Home / Self Care | Payer: 59 | Source: Ambulatory Visit | Attending: Family Medicine | Admitting: Family Medicine

## 2017-04-30 ENCOUNTER — Encounter (HOSPITAL_COMMUNITY): Payer: Self-pay

## 2017-04-30 DIAGNOSIS — R600 Localized edema: Secondary | ICD-10-CM

## 2017-05-12 ENCOUNTER — Ambulatory Visit (INDEPENDENT_AMBULATORY_CARE_PROVIDER_SITE_OTHER): Payer: PRIVATE HEALTH INSURANCE | Admitting: Allergy and Immunology

## 2017-05-12 ENCOUNTER — Encounter: Payer: Self-pay | Admitting: Allergy and Immunology

## 2017-05-12 VITALS — BP 134/76 | HR 64 | Resp 16 | Ht 64.0 in | Wt 160.0 lb

## 2017-05-12 DIAGNOSIS — J339 Nasal polyp, unspecified: Secondary | ICD-10-CM | POA: Diagnosis not present

## 2017-05-12 DIAGNOSIS — J3089 Other allergic rhinitis: Secondary | ICD-10-CM

## 2017-05-12 DIAGNOSIS — J4540 Moderate persistent asthma, uncomplicated: Secondary | ICD-10-CM

## 2017-05-12 MED ORDER — FLUTICASONE FUROATE-VILANTEROL 100-25 MCG/INH IN AEPB
1.0000 | INHALATION_SPRAY | RESPIRATORY_TRACT | 0 refills | Status: DC
Start: 2017-05-13 — End: 2017-07-30

## 2017-05-12 NOTE — Progress Notes (Signed)
Follow-up Note  Referring Provider: Laurann Montana, MD Primary Provider: Laurann Montana, MD Date of Office Visit: 05/12/2017  Subjective:   Kelsey Hart (DOB: Nov 18, 1950) is a 66 y.o. female who [facility] in re-evaluation of the following:  HPI: Kelsey Hart presents to this clinic in evaluation of asthma and allergic rhinoconjunctivitis and hoarseness. I have not seen her in this clinic since October 2017.  Her asthma hass been under excellent control and she has not required a systemic steroid to treat an exacerbation. Currently she is using Breo 100 every day. Rarely does she use a short acting bronchodilator.  She has been having some congestion of her upper airways. For some reason which is not thoroughly explained she does not use any nasal steroids. She has not required an antibiotic to treat an episode of sinusitis.  She continues to have problems with hoarseness and she thinks it might be a little bit worse recently. She does not have any significant reflux symptoms.  She continues on Humira for her rheumatoid arthritis. Recently she was treated for lower extremity cellulitis.  Allergies as of 05/12/2017   No Known Allergies     Medication List      albuterol 108 (90 Base) MCG/ACT inhaler Commonly known as:  PROAIR HFA Inhale two puffs every four to six hours as needed for cough or wheeze.   ARMOUR THYROID 240 MG tablet Generic drug:  thyroid TK 1 T PO ON AN EMPTY STOMACH ONCE A DAY FOR 30 DAYS   CRANBERRY PO Take 8,400 mg by mouth daily.   fluticasone furoate-vilanterol 100-25 MCG/INH Aepb Commonly known as:  BREO ELLIPTA Inhale one puff once daily to prevent cough or wheeze   folic acid 800 MCG tablet Commonly known as:  FOLVITE Take 800 mcg by mouth daily.   HUMIRA PEN [place] Inject 40 mg into the skin. Twice monthly.   loratadine 10 MG tablet Commonly known as:  CLARITIN Take 10 mg by mouth daily as needed for  allergies.   multivitamin tablet Take 1 tablet by mouth daily.   PROBIOTIC DAILY PO Take by mouth daily.   SAM-e 400 MG Tabs Take by mouth daily.   VITAMIN D PO Take 1,000 Units by mouth daily.       Past Medical History:  Diagnosis Date  . Allergy   . Arthritis    Rheumatoid  . Broken ankle 1980  . Chronic kidney disease   . SVT (supraventricular tachycardia) (HCC)   . Thyroid disease     Past Surgical History:  Procedure Laterality Date  . ANKLE SURGERY  1980  . FRACTURE SURGERY      Review of systems negative except as noted in HPI / PMHx or noted below:  Review of Systems  Constitutional: Negative.   HENT: Negative.   Eyes: Negative.   Respiratory: Negative.   Cardiovascular: Negative.   Gastrointestinal: Negative.   Genitourinary: Negative.   Musculoskeletal: Negative.   Skin: Negative.   Neurological: Negative.   Endo/Heme/Allergies: Negative.   Psychiatric/Behavioral: Negative.      Objective:   Vitals:   05/12/17 1525  BP: 134/76  Pulse: 64  Resp: 16   Height: 5\' 4"  (162.6 cm)  Weight: 160 lb (72.6 kg)   Physical Exam  Constitutional: She is well-developed, well-nourished, and in no distress.  Slightly raspy voice  HENT:  Head: Normocephalic.  Right Ear: Tympanic membrane, external ear and ear canal normal.  Left  Ear: Tympanic membrane, external ear and ear canal normal.  Nose: Nose normal. No mucosal edema (Right nasal polyp) or rhinorrhea.  Mouth/Throat: Uvula is midline, oropharynx is clear and moist and mucous membranes are normal. No oropharyngeal exudate.  Eyes: Conjunctivae are normal.  Neck: Trachea normal. No tracheal tenderness present. No tracheal deviation present. No thyromegaly present.  Cardiovascular: Normal rate, regular rhythm, S1 normal, S2 normal and normal heart sounds.   No murmur heard. Pulmonary/Chest: Breath sounds normal. No stridor. No respiratory distress. She has no wheezes. She has no rales.    Musculoskeletal: She exhibits no edema.  Lymphadenopathy:       Head (right side): No tonsillar adenopathy present.       Head (left side): No tonsillar adenopathy present.    She has no cervical adenopathy.  Neurological: She is alert. Gait normal.  Skin: No rash noted. She is not diaphoretic. No erythema. Nails show no clubbing.  Psychiatric: Mood and affect normal.    Diagnostics:    Spirometry was performed and demonstrated an FEV1 of 2.77 at 117 % of predicted.  The patient had an Asthma Control Test with the following results: ACT Total Score: 25.    Assessment and Plan:   1. Asthma, moderate persistent, well-controlled   2. Other allergic rhinitis   3. Nasal polyposis     1. Continue to Treat and prevent inflammation:   A. Decrease Breo 100 one inhalation 3 times per week  B. OTC Rhinocort/Nasacort/Flonase 1-2 spray each nostril one time per day      2. If needed:   A. ProAir HFA 2 puffs every 4-6 hours  B. OTC antihistamine - Claritin/Zyrtec - one time per day  3. Evaluation with ENT to check throat for hoarseness   4. Return to clinic in 6 months or earlier if problem  5. Obtain fall flu vaccine  Samaiya appears to be doing very well and I am going to decrease her dose of Breo to 3 times per week and assume that she will continue to maintain good control of her asthma and maybe this will help her hoarseness. As well, I have asked her to consistently use a nasal steroid especially given the fact that it does appear as though she has some component of nasal polyposis. She has tried montelukast in the past but has never been impressed about using this medication. We will have her throat checked by an ENT doctor. I will see her back in this clinic in 6 months or earlier if there is a problem.  Kelsey Schimke, MD Allergy / Immunology [facility]

## 2017-05-12 NOTE — Patient Instructions (Signed)
  1. Continue to Treat and prevent inflammation:   A. Decrease Breo 100 one inhalation 3 times per week  B. OTC Rhinocort/Nasacort/Flonase 1-2 spray each nostril one time per day      2. If needed:   A. ProAir HFA 2 puffs every 4-6 hours  B. OTC antihistamine - Claritin/Zyrtec - one time per day  3. Evaluation with ENT to check throat for hoarseness   4. Return to clinic in 6 months or earlier if problem  5. Obtain fall flu vaccine

## 2017-05-21 ENCOUNTER — Telehealth: Payer: Self-pay | Admitting: Allergy and Immunology

## 2017-05-21 NOTE — Telephone Encounter (Signed)
Please advise 

## 2017-05-21 NOTE — Telephone Encounter (Signed)
Please informed patient that it is possible the nasal steroid is increasing her eye pressure although this would be not very common. She should try to use Rhinocort only 3 times a week on Monday Wednesday and Friday and see if she gets benefit without a side effect over the next week or so. She can let us know about this issue next week or the week after.

## 2017-05-21 NOTE — Telephone Encounter (Signed)
Called Patient. Left message for patient to call the office that way I can share the information giving by Dr. Lucie Leather.

## 2017-05-21 NOTE — Telephone Encounter (Signed)
Patient called and said she saw Dr. Lucie Leather on 05-12-17. She was started on Rhinocort and since starting it, she has had vision problems and swelling of the eyes.

## 2017-05-22 NOTE — Telephone Encounter (Signed)
Left message to call office

## 2017-05-26 NOTE — Telephone Encounter (Signed)
Left message for patient to call office.  

## 2017-05-26 NOTE — Telephone Encounter (Signed)
Talked with the patient about Dr. Kathyrn Lass recommendation, she said she would try that to see if she gets the benefit of the medication without the side effect and if not she would give [place] a call back.

## 2017-07-30 ENCOUNTER — Other Ambulatory Visit: Payer: Self-pay | Admitting: Allergy and Immunology

## 2017-11-09 ENCOUNTER — Ambulatory Visit (INDEPENDENT_AMBULATORY_CARE_PROVIDER_SITE_OTHER): Payer: PRIVATE HEALTH INSURANCE | Admitting: Physician Assistant

## 2017-11-09 ENCOUNTER — Encounter: Payer: Self-pay | Admitting: Physician Assistant

## 2017-11-09 ENCOUNTER — Other Ambulatory Visit: Payer: Self-pay

## 2017-11-09 VITALS — BP 124/82 | HR 65 | Temp 97.6°F | Resp 18 | Ht 64.0 in | Wt 157.0 lb

## 2017-11-09 DIAGNOSIS — Z0000 Encounter for general adult medical examination without abnormal findings: Secondary | ICD-10-CM | POA: Diagnosis not present

## 2017-11-09 DIAGNOSIS — Z13220 Encounter for screening for lipoid disorders: Secondary | ICD-10-CM | POA: Diagnosis not present

## 2017-11-09 DIAGNOSIS — Z23 Encounter for immunization: Secondary | ICD-10-CM | POA: Diagnosis not present

## 2017-11-09 DIAGNOSIS — Z1211 Encounter for screening for malignant neoplasm of colon: Secondary | ICD-10-CM | POA: Diagnosis not present

## 2017-11-09 DIAGNOSIS — M069 Rheumatoid arthritis, unspecified: Secondary | ICD-10-CM

## 2017-11-09 DIAGNOSIS — D899 Disorder involving the immune mechanism, unspecified: Secondary | ICD-10-CM | POA: Diagnosis not present

## 2017-11-09 DIAGNOSIS — E2839 Other primary ovarian failure: Secondary | ICD-10-CM | POA: Diagnosis not present

## 2017-11-09 DIAGNOSIS — I471 Supraventricular tachycardia, unspecified: Secondary | ICD-10-CM

## 2017-11-09 DIAGNOSIS — F439 Reaction to severe stress, unspecified: Secondary | ICD-10-CM

## 2017-11-09 DIAGNOSIS — D849 Immunodeficiency, unspecified: Secondary | ICD-10-CM

## 2017-11-09 DIAGNOSIS — Z6826 Body mass index (BMI) 26.0-26.9, adult: Secondary | ICD-10-CM

## 2017-11-09 DIAGNOSIS — N189 Chronic kidney disease, unspecified: Secondary | ICD-10-CM

## 2017-11-09 DIAGNOSIS — E079 Disorder of thyroid, unspecified: Secondary | ICD-10-CM

## 2017-11-09 LAB — POCT URINALYSIS DIP (MANUAL ENTRY)
Bilirubin, UA: NEGATIVE
Blood, UA: NEGATIVE
Glucose, UA: NEGATIVE mg/dL
Ketones, POC UA: NEGATIVE mg/dL
Leukocytes, UA: NEGATIVE
NITRITE UA: NEGATIVE
Protein Ur, POC: NEGATIVE mg/dL
Spec Grav, UA: 1.01 (ref 1.010–1.025)
UROBILINOGEN UA: 0.2 U/dL
pH, UA: 7 (ref 5.0–8.0)

## 2017-11-09 NOTE — Patient Instructions (Addendum)
Please schedule the therapy. I think it will make a big difference in your quality of life.    IF you received an x-ray today, you will receive an invoice from [facility]. Please contact [facility] at [phone] with questions or concerns regarding your invoice.   IF you received labwork today, you will receive an invoice from [place]. Please contact [facility] at [phone] with questions or concerns regarding your invoice.   Our billing staff will not be able to assist you with questions regarding bills from these companies.  You will be contacted with the lab results as soon as they are available. The fastest way to get your results is to activate your My Chart account. Instructions are located on the last page of this paperwork. If you have not heard from [place] regarding the results in 2 weeks, please contact this office.     Preventive Care 55 Years and Older, Female Preventive care refers to lifestyle choices and visits with your health care provider that can promote health and wellness. What does preventive care include?  A yearly physical exam. This is also called an annual well check.  Dental exams once or twice a year.  Routine eye exams. Ask your health care provider how often you should have your eyes checked.  Personal lifestyle choices, including: ? Daily care of your teeth and gums. ? Regular physical activity. ? Eating a healthy diet. ? Avoiding tobacco and drug use. ? Limiting alcohol use. ? Practicing safe sex. ? Taking low-dose aspirin every day. ? Taking vitamin and mineral supplements as recommended by your health care provider. What happens during an annual well check? The services and screenings done by your health care provider during your annual well check will depend on your age, overall health, lifestyle risk factors, and family history of disease. Counseling Your health care provider may ask you questions about your:  Alcohol  use.  Tobacco use.  Drug use.  Emotional well-being.  Home and relationship well-being.  Sexual activity.  Eating habits.  History of falls.  Memory and ability to understand (cognition).  Work and work Statistician.  Reproductive health.  Screening You may have the following tests or measurements:  Height, weight, and BMI.  Blood pressure.  Lipid and cholesterol levels. These may be checked every 5 years, or more frequently if you are over 5 years old.  Skin check.  Lung cancer screening. You may have this screening every year starting at age 52 if you have a 30-pack-year history of smoking and currently smoke or have quit within the past 15 years.  Fecal occult blood test (FOBT) of the stool. You may have this test every year starting at age 29.  Flexible sigmoidoscopy or colonoscopy. You may have a sigmoidoscopy every 5 years or a colonoscopy every 10 years starting at age 37.  Hepatitis C blood test.  Hepatitis B blood test.  Sexually transmitted disease (STD) testing.  Diabetes screening. This is done by checking your blood sugar (glucose) after you have not eaten for a while (fasting). You may have this done every 1-3 years.  Bone density scan. This is done to screen for osteoporosis. You may have this done starting at age 54.  Mammogram. This may be done every 1-2 years. Talk to your health care provider about how often you should have regular mammograms.  Talk with your health care provider about your test results, treatment options, and if necessary, the need for more tests. Vaccines Your health care  provider may recommend certain vaccines, such as:  Influenza vaccine. This is recommended every year.  Tetanus, diphtheria, and acellular pertussis (Tdap, Td) vaccine. You may need a Td booster every 10 years.  Varicella vaccine. You may need this if you have not been vaccinated.  Zoster vaccine. You may need this after age 62.  Measles, mumps, and  rubella (MMR) vaccine. You may need at least one dose of MMR if you were born in 1957 or later. You may also need a second dose.  Pneumococcal 13-valent conjugate (PCV13) vaccine. One dose is recommended after age 70.  Pneumococcal polysaccharide (PPSV23) vaccine. One dose is recommended after age 63.  Meningococcal vaccine. You may need this if you have certain conditions.  Hepatitis A vaccine. You may need this if you have certain conditions or if you travel or work in places where you may be exposed to hepatitis A.  Hepatitis B vaccine. You may need this if you have certain conditions or if you travel or work in places where you may be exposed to hepatitis B.  Haemophilus influenzae type b (Hib) vaccine. You may need this if you have certain conditions.  Talk to your health care provider about which screenings and vaccines you need and how often you need them. This information is not intended to replace advice given to you by your health care provider. Make sure you discuss any questions you have with your health care provider. Document Released: 11/23/2015 Document Revised: 07/16/2016 Document Reviewed: 08/28/2015 Elsevier Interactive Patient Education  [employer].

## 2017-11-09 NOTE — Assessment & Plan Note (Signed)
Due to Humira treatment of RA.  Influenza and pneumococcal vaccines administered today. Pneumovax booster in 12 months.

## 2017-11-09 NOTE — Assessment & Plan Note (Signed)
Encouraged her to schedule with therapist. Would benefit from CBT, increased self-care.

## 2017-11-09 NOTE — Assessment & Plan Note (Signed)
Update renal function today. Avoid NSAIDS.

## 2017-11-09 NOTE — Progress Notes (Signed)
Patient ID: Kelsey Hart, female    DOB: 1951/05/23, 66 y.o.   MRN: [id number]  PCP: Laurann Montana, MD  Chief Complaint  Patient presents with  . Annual Exam  . Depression    Depression scale score 1    Subjective:   Presents for [employer] Visit.  She was unable to get in for Annual Exam with her primary in time to meet the end-of-year requirement with her work.   Family dynamics remain unchanged, though her son, age 19 years, has moved in with his girlfriend, who has insisted that he get a job and contribute to the household. The patient anticipates that he'll tire of that and come home. Her husband was diagnosed with CHF in 12/2016 and now has a pacemaker. He isn't following up with cardiology. He fell getting out of his vehicle during the recent snow and broke his femur, and despite instructions for non-weight-bearing, he is walking on it.  Cervical Cancer Screening: 05/2015. Normal pathology. NEGATIVE HPV. No history of abnormal. Repeat in 2021. Breast Cancer Screening: 08/2016 at [facility], reportedly normal Colorectal Cancer Screening: not yet. Doesn't trust her son or husband to drive her home following the procedure. Bone Density Testing: 2014?, doesn't recall specifics, may have had some bone loss in the hips. HIV Screening: will do today STI Screening: very low, not currently sexually active Seasonal Influenza Vaccination: current Td/Tdap Vaccination: 03/2016 Pneumococcal Vaccination: had Pneumovax in 2014?, plan to re-initiate series today Zoster Vaccination: not yet. Frequency of Dental evaluation: Q6 months Frequency of Eye evaluation: annually   Patient Active Problem List   Diagnosis Date Noted  . Thyroid disease   . Allergy   . Chronic kidney disease   . SVT (supraventricular tachycardia) (HCC)   . Rheumatoid arthritis (HCC) 08/30/2013    Past Medical History:  Diagnosis Date  . Allergy   . Arthritis    Rheumatoid  . Broken ankle 1980  . Chronic  kidney disease   . SVT (supraventricular tachycardia) (HCC)   . Thyroid disease      Prior to Admission medications   Medication Sig Start Date End Date Taking? Authorizing Provider  Adalimumab (HUMIRA PEN [place]) Inject 40 mg into the skin. Twice monthly.   Yes [provider]  albuterol (PROAIR HFA) 108 (90 Base) MCG/ACT inhaler Inhale two puffs every four to six hours as needed for cough or wheeze. 12/25/15  Yes Kozlow, Alvira Philips, MD  ARMOUR THYROID 240 MG tablet TK 1 T PO ON AN EMPTY STOMACH ONCE A DAY FOR 30 DAYS 11/23/15  Yes [provider]  CRANBERRY PO Take 8,400 mg by mouth daily.   Yes [provider]  fluticasone furoate-vilanterol (BREO ELLIPTA) 100-25 MCG/INH AEPB Inhale one puff once daily as directed. Rinse mouth after use. 07/30/17  Yes Kozlow, Alvira Philips, MD  folic acid (FOLVITE) 800 MCG tablet Take 800 mcg by mouth daily.   Yes [provider]  loratadine (CLARITIN) 10 MG tablet Take 10 mg by mouth daily as needed for allergies.   Yes [provider]  Multiple Vitamin (MULTIVITAMIN) tablet Take 1 tablet by mouth daily.   Yes [provider]  Probiotic Product (PROBIOTIC DAILY PO) Take by mouth daily.   Yes [provider]  S-Adenosylmethionine (SAM-E) 400 MG TABS Take by mouth daily.   Yes [provider]  TURMERIC PO Take by mouth.   Yes [provider]  Cholecalciferol (VITAMIN D PO) Take 1,000 Units by mouth daily.  [provider]    No Known Allergies  Past Surgical History:  Procedure Laterality Date  . ANKLE SURGERY  1980  . FRACTURE SURGERY      Family History  Problem Relation Age of Onset  . Dementia Mother   . Heart disease Father   . Diabetes Maternal Grandmother   . Dementia Maternal Grandmother   . Stroke Maternal Grandfather   . Heart disease Paternal Grandmother   . Stroke Paternal Grandfather   . Diabetes Maternal Uncle     Social History   Socioeconomic History   . Marital status: Married    Spouse name: Carlis Abbott  . Number of children: 0  . Years of education: college  . Highest education level: None  Social Needs  . Financial resource strain: None  . Food insecurity - worry: None  . Food insecurity - inability: None  . Transportation needs - medical: None  . Transportation needs - non-medical: None  Occupational History  . Occupation: ADMINISTRATIVE ASSISTANT    Employer: [employer]  Tobacco Use  . Smoking status: Never Smoker  . Smokeless tobacco: Never Used  Substance and Sexual Activity  . Alcohol use: No  . Drug use: No  . Sexual activity: No    Partners: Male    Birth control/protection: None  Other Topics Concern  . None  Social History Narrative   Lives with her husband.   Adopted son lives with them, due to unemployment. "He is a Nurse, learning disability" and doesn't believe that he needs to work." "he is a pot-head." History of violence, but not since adolescence.   Husband has chronic pain, seeing a specialist. Lot's of pharmaceuticals. Not violent, but they have nearly no relationship.       Review of Systems  Constitutional: Negative.   HENT: Positive for congestion, sinus pressure, tinnitus (RIGHT) and voice change. Negative for dental problem, drooling, ear discharge, ear pain, facial swelling, hearing loss, mouth sores, nosebleeds, postnasal drip, rhinorrhea, sinus pain, sneezing, sore throat and trouble swallowing.   Eyes: Negative.   Respiratory: Negative.   Cardiovascular: Positive for leg swelling. Negative for chest pain and palpitations.  Gastrointestinal: Negative.   Endocrine: Negative.   Genitourinary: Negative.   Musculoskeletal: Positive for arthralgias. Negative for back pain, gait problem, joint swelling, myalgias, neck pain and neck stiffness.       LEFT knee, also intermittent LEFT hip. Not preventing her from performing her regular activities.  Skin: Negative.   Allergic/Immunologic: Positive  for environmental allergies and immunocompromised state (Humira for RA, dose today). Negative for food allergies.  Neurological: Negative.   Hematological: Negative.   Psychiatric/Behavioral: Positive for decreased concentration and dysphoric mood (PCP has encouraged CBT, plans to schedule, but hasn't yet). Negative for agitation, behavioral problems, confusion, hallucinations, self-injury, sleep disturbance and suicidal ideas. The patient is not nervous/anxious and is not hyperactive.         Objective:  Physical Exam  Constitutional: She is oriented to person, place, and time. Vital signs are normal. She appears well-developed and well-nourished. She is active and cooperative. No distress.  BP 124/82 (BP Location: Left Arm, Patient Position: Sitting, Cuff Size: Normal)   Pulse 65   Temp 97.6 F (36.4 C) (Oral)   Resp 18   Ht 5\' 4"  (1.626 m)   Wt 157 lb (71.2 kg)   SpO2 97%   BMI 26.95 kg/m    HENT:  Head: Normocephalic and atraumatic.  Right Ear: Hearing, tympanic membrane, external ear and ear  canal normal. No foreign bodies.  Left Ear: Hearing, tympanic membrane, external ear and ear canal normal. No foreign bodies.  Nose: Nose normal.  Mouth/Throat: Uvula is midline, oropharynx is clear and moist and mucous membranes are normal. No oral lesions. Normal dentition. No dental abscesses or uvula swelling. No oropharyngeal exudate.  Eyes: Conjunctivae, EOM and lids are normal. Pupils are equal, round, and reactive to light. Right eye exhibits no discharge. Left eye exhibits no discharge. No scleral icterus.  Fundoscopic exam:      The right eye shows no arteriolar narrowing, no AV nicking, no exudate, no hemorrhage and no papilledema. The right eye shows red reflex.       The left eye shows no arteriolar narrowing, no AV nicking, no exudate, no hemorrhage and no papilledema. The left eye shows red reflex.  Neck: Trachea normal, normal range of motion and full passive range of motion  without pain. Neck supple. No spinous process tenderness and no muscular tenderness present. No thyroid mass and no thyromegaly present.  Cardiovascular: Normal rate, regular rhythm, normal heart sounds, intact distal pulses and normal pulses.  Pulmonary/Chest: Effort normal and breath sounds normal. Right breast exhibits no inverted nipple, no mass, no nipple discharge, no skin change and no tenderness. Left breast exhibits no inverted nipple, no mass, no nipple discharge, no skin change and no tenderness. Breasts are symmetrical.  Musculoskeletal: She exhibits no edema or tenderness.       Cervical back: Normal.       Thoracic back: Normal.       Lumbar back: Normal.  Lymphadenopathy:       Head (right side): No tonsillar, no preauricular, no posterior auricular and no occipital adenopathy present.       Head (left side): No tonsillar, no preauricular, no posterior auricular and no occipital adenopathy present.    She has no cervical adenopathy.       Right: No supraclavicular adenopathy present.       Left: No supraclavicular adenopathy present.  Neurological: She is alert and oriented to person, place, and time. She has normal strength and normal reflexes. No cranial nerve deficit. She exhibits normal muscle tone. Coordination and gait normal.  Skin: Skin is warm, dry and intact. No rash noted. She is not diaphoretic. No cyanosis or erythema. Nails show no clubbing.  Psychiatric: She has a normal mood and affect. Her speech is normal and behavior is normal. Judgment and thought content normal.    Results for orders placed or performed in visit on 11/09/17  POCT urinalysis dipstick  Result Value Ref Range   Color, UA yellow yellow   Clarity, UA clear clear   Glucose, UA negative negative mg/dL   Bilirubin, UA negative negative   Ketones, POC UA negative negative mg/dL   Spec Grav, UA 5.809 9.833 - 1.025   Blood, UA negative negative   pH, UA 7.0 5.0 - 8.0   Protein Ur, POC negative  negative mg/dL   Urobilinogen, UA 0.2 0.2 or 1.0 E.U./dL   Nitrite, UA Negative Negative   Leukocytes, UA Negative Negative          Assessment & Plan:   Problem List Items Addressed This Visit    Rheumatoid arthritis (HCC) (Chronic)    Stable on Humira. Continue per rheumatology.      Thyroid disease    Normal labs reported 07/2017 with PCP. Continue current treatment.      Relevant Medications   metoprolol tartrate (LOPRESSOR) 25 MG  tablet   Chronic kidney disease    Update renal function today. Avoid NSAIDS.      Relevant Orders   POCT urinalysis dipstick (Completed)   CBC with Differential/Platelet   Comprehensive metabolic panel   SVT (supraventricular tachycardia) (HCC)    Asymptomatic. PRN metoprolol.      Relevant Medications   metoprolol tartrate (LOPRESSOR) 25 MG tablet   Immunosuppressed status (HCC)    Due to Humira treatment of RA.  Influenza and pneumococcal vaccines administered today. Pneumovax booster in 12 months.      BMI 26.0-26.9,adult    Healthy eating and regular exercise.      Stress at home    Encouraged her to schedule with therapist. Would benefit from CBT, increased self-care.       Other Visit Diagnoses    Annual physical exam    -  Primary   Age appropriate anticipatory guidance.   Need for influenza vaccination       received elsewhere   Screening for colon cancer       Agrees to Cologuard.   Relevant Orders   Cologuard   Need for pneumococcal vaccination       Relevant Orders   Pneumococcal conjugate vaccine 13-valent IM (Completed)   Estrogen deficiency       Relevant Orders   DG Bone Density   Screening for hyperlipidemia       Relevant Orders   Lipid panel       Return if symptoms worsen or fail to improve.   Fernande Bras, PA-C [facility] at [facility]

## 2017-11-09 NOTE — Assessment & Plan Note (Signed)
Asymptomatic. PRN metoprolol.

## 2017-11-09 NOTE — Assessment & Plan Note (Signed)
Normal labs reported 07/2017 with PCP. Continue current treatment.

## 2017-11-09 NOTE — Assessment & Plan Note (Signed)
Stable on Humira. Continue per rheumatology.

## 2017-11-09 NOTE — Assessment & Plan Note (Signed)
Healthy eating and regular exercise. 

## 2017-11-10 ENCOUNTER — Encounter: Payer: Self-pay | Admitting: Physician Assistant

## 2017-11-10 LAB — CBC WITH DIFFERENTIAL/PLATELET
Basophils Absolute: 0 10*3/uL (ref 0.0–0.2)
Basos: 0 %
EOS (ABSOLUTE): 0.2 10*3/uL (ref 0.0–0.4)
Eos: 5 %
HEMATOCRIT: 40.6 % (ref 34.0–46.6)
Hemoglobin: 13.5 g/dL (ref 11.1–15.9)
IMMATURE GRANULOCYTES: 0 %
Immature Grans (Abs): 0 10*3/uL (ref 0.0–0.1)
Lymphocytes Absolute: 2 10*3/uL (ref 0.7–3.1)
Lymphs: 43 %
MCH: 30 pg (ref 26.6–33.0)
MCHC: 33.3 g/dL (ref 31.5–35.7)
MCV: 90 fL (ref 79–97)
MONOCYTES: 12 %
MONOS ABS: 0.6 10*3/uL (ref 0.1–0.9)
NEUTROS PCT: 40 %
Neutrophils Absolute: 1.9 10*3/uL (ref 1.4–7.0)
Platelets: 280 10*3/uL (ref 150–379)
RBC: 4.5 x10E6/uL (ref 3.77–5.28)
RDW: 13.4 % (ref 12.3–15.4)
WBC: 4.6 10*3/uL (ref 3.4–10.8)

## 2017-11-10 LAB — COMPREHENSIVE METABOLIC PANEL
ALT: 27 IU/L (ref 0–32)
AST: 20 IU/L (ref 0–40)
Albumin/Globulin Ratio: 1.7 (ref 1.2–2.2)
Albumin: 4.5 g/dL (ref 3.6–4.8)
Alkaline Phosphatase: 55 IU/L (ref 39–117)
BUN/Creatinine Ratio: 15 (ref 12–28)
BUN: 11 mg/dL (ref 8–27)
Bilirubin Total: 0.5 mg/dL (ref 0.0–1.2)
CALCIUM: 9.9 mg/dL (ref 8.7–10.3)
CO2: 23 mmol/L (ref 20–29)
CREATININE: 0.75 mg/dL (ref 0.57–1.00)
Chloride: 103 mmol/L (ref 96–106)
GFR calc Af Amer: 97 mL/min/{1.73_m2} (ref >59)
GFR, EST NON AFRICAN AMERICAN: 84 mL/min/{1.73_m2} (ref >59)
GLOBULIN, TOTAL: 2.7 g/dL (ref 1.5–4.5)
GLUCOSE: 93 mg/dL (ref 65–99)
Potassium: 4.4 mmol/L (ref 3.5–5.2)
SODIUM: 141 mmol/L (ref 134–144)
Total Protein: 7.2 g/dL (ref 6.0–8.5)

## 2017-11-10 LAB — LIPID PANEL
CHOL/HDL RATIO: 3.1 ratio (ref 0.0–4.4)
Cholesterol, Total: 172 mg/dL (ref 100–199)
HDL: 56 mg/dL (ref >39)
LDL CALC: 97 mg/dL (ref 0–99)
TRIGLYCERIDES: 97 mg/dL (ref 0–149)
VLDL CHOLESTEROL CAL: 19 mg/dL (ref 5–40)

## 2018-07-07 ENCOUNTER — Encounter: Payer: Self-pay | Admitting: Allergy and Immunology

## 2018-07-07 ENCOUNTER — Other Ambulatory Visit: Payer: Self-pay | Admitting: Allergy and Immunology

## 2018-07-07 ENCOUNTER — Ambulatory Visit: Payer: PRIVATE HEALTH INSURANCE | Admitting: Allergy and Immunology

## 2018-07-07 VITALS — BP 126/84 | HR 58 | Resp 16 | Ht 64.0 in | Wt 158.2 lb

## 2018-07-07 DIAGNOSIS — J339 Nasal polyp, unspecified: Secondary | ICD-10-CM

## 2018-07-07 DIAGNOSIS — D899 Disorder involving the immune mechanism, unspecified: Secondary | ICD-10-CM

## 2018-07-07 DIAGNOSIS — J4540 Moderate persistent asthma, uncomplicated: Secondary | ICD-10-CM | POA: Diagnosis not present

## 2018-07-07 DIAGNOSIS — J3089 Other allergic rhinitis: Secondary | ICD-10-CM

## 2018-07-07 DIAGNOSIS — D849 Immunodeficiency, unspecified: Secondary | ICD-10-CM

## 2018-07-07 MED ORDER — ALBUTEROL SULFATE HFA 108 (90 BASE) MCG/ACT IN AERS: INHALATION_SPRAY | RESPIRATORY_TRACT | 1 refills | Status: DC

## 2018-07-07 MED ORDER — LORATADINE 10 MG PO TABS: 10.0000 mg | ORAL_TABLET | Freq: Every day | ORAL | 5 refills | Status: DC | PRN

## 2018-07-07 MED ORDER — FLUTICASONE FUROATE-VILANTEROL 100-25 MCG/INH IN AEPB: INHALATION_SPRAY | RESPIRATORY_TRACT | 4 refills | Status: DC

## 2018-07-07 NOTE — Progress Notes (Signed)
Follow-up Note  Referring Provider: Laurann Montana, MD Primary Provider: Laurann Montana, MD Date of Office Visit: 07/07/2018  Subjective:   Kelsey Hart (DOB: 03-Jul-1951) is a 67 y.o. female who [facility] in re-evaluation of the following:  HPI: Kelsey Hart returns to this clinic in reevaluation of her asthma and allergic rhinoconjunctivitis and history of nasal polyposis and hoarseness and immunosuppression with the use of Humira for her rheumatoid arthritis.  Her last visit to this clinic was 12 May 2017.  Overall she has done very well with her respiratory tract.  She is currently using her Breo at 3 times a week and this has resulted in excellent control of her asthma and she rarely uses a short acting bronchodilator and it does not sound as though she has required a systemic steroid to treat any type of respiratory tract issue.  In addition, her nose has really been doing quite well and she intermittently uses a nasal steroid and has not required an antibiotic to treat an episode of sinusitis.  During her last visit she had some hoarseness and we recommended that she have visit with ENT for a thorough evaluation of her laryngeal structures but she has not followed up with that recommendation and overall she thinks her hoarseness may be somewhat better or at least stable without the development of any new problems.  She continues on Humira for rheumatoid arthritis.  Allergies as of 07/07/2018   No Known Allergies     Medication List      albuterol 108 (90 Base) MCG/ACT inhaler Commonly known as:  PROVENTIL HFA;VENTOLIN HFA Inhale two puffs every four to six hours as needed for cough or wheeze.   ARMOUR THYROID 240 MG tablet Generic drug:  thyroid TK 1 T PO ON AN EMPTY STOMACH ONCE A DAY FOR 30 DAYS   CRANBERRY PO Take 8,400 mg by mouth daily.   fluticasone furoate-vilanterol 100-25 MCG/INH Aepb Commonly known as:  BREO  ELLIPTA Inhale one puff once daily as directed. Rinse mouth after use.   folic acid 800 MCG tablet Commonly known as:  FOLVITE Take 800 mcg by mouth daily.   HUMIRA PEN [place] Inject 40 mg into the skin. Twice monthly.   loratadine 10 MG tablet Commonly known as:  CLARITIN Take 10 mg by mouth daily as needed for allergies.   metoprolol tartrate 25 MG tablet Commonly known as:  LOPRESSOR TK 1 T PO BID WF PRN   multivitamin tablet Take 1 tablet by mouth daily.   PROBIOTIC DAILY PO Take by mouth daily.   SAM-e 400 MG Tabs Take by mouth daily.   TURMERIC PO Take by mouth.   VITAMIN D PO Take 1,000 Units by mouth daily.       Past Medical History:  Diagnosis Date  . Allergy   . Arthritis    Rheumatoid  . Broken ankle 1980  . Chronic kidney disease   . SVT (supraventricular tachycardia) (HCC)   . Thyroid disease     Past Surgical History:  Procedure Laterality Date  . ANKLE SURGERY  1980  . FRACTURE SURGERY      Review of systems negative except as noted in HPI / PMHx or noted below:  Review of Systems  Constitutional: Negative.   HENT: Negative.   Eyes: Negative.   Respiratory: Negative.   Cardiovascular: Negative.   Gastrointestinal: Negative.   Genitourinary: Negative.   Musculoskeletal: Negative.   Skin: Negative.  Neurological: Negative.   Endo/Heme/Allergies: Negative.   Psychiatric/Behavioral: Negative.      Objective:   Vitals:   07/07/18 1112  BP: 126/84  Pulse: (!) 58  Resp: 16  SpO2: 98%   Height: 5\' 4"  (162.6 cm)  Weight: 158 lb 3.2 oz (71.8 kg)   Physical Exam  HENT:  Head: Normocephalic.  Right Ear: Tympanic membrane, external ear and ear canal normal.  Left Ear: Tympanic membrane, external ear and ear canal normal.  Nose: Nose normal. No mucosal edema (No nasal polyps) or rhinorrhea.  Mouth/Throat: Uvula is midline, oropharynx is clear and moist and mucous membranes are normal. No oropharyngeal exudate.  Eyes:  Conjunctivae are normal.  Neck: Trachea normal. No tracheal tenderness present. No tracheal deviation present. No thyromegaly present.  Cardiovascular: Normal rate, regular rhythm, S1 normal, S2 normal and normal heart sounds.  No murmur heard. Pulmonary/Chest: Breath sounds normal. No stridor. No respiratory distress. She has no wheezes. She has no rales.  Musculoskeletal: She exhibits no edema.  Lymphadenopathy:       Head (right side): No tonsillar adenopathy present.       Head (left side): No tonsillar adenopathy present.    She has no cervical adenopathy.  Neurological: She is alert.  Skin: No rash noted. She is not diaphoretic. No erythema. Nails show no clubbing.    Diagnostics:    Spirometry was performed and demonstrated an FEV1 of 2.68 at 110 % of predicted.  Assessment and Plan:   1. Asthma, moderate persistent, well-controlled   2. Other allergic rhinitis   3. Nasal polyposis   4. Immunosuppressed status (HCC)     1. Continue to Treat and prevent inflammation:   A.  Breo 100 one inhalation 3 times per week  B. OTC Rhinocort/Nasacort/Flonase 1-2 spray each nostril 1 time per day      2. If needed:   A. ProAir HFA 2 puffs every 4-6 hours  B. OTC antihistamine - Claritin/Zyrtec - one time per day  3. Return to clinic in 12 months or earlier if problem  4. Obtain fall flu vaccine  Kelsey Hart appears to be doing very well on her current therapy and I am not going to change any of the anti-inflammatory agents for her respiratory tract at the current dosing.  She has a very good understanding of these medications and how they work and when to vary the dose.  I will see her back in this clinic in 1 year or earlier if there is a problem.  Kelsey Dandy, MD Allergy / Immunology [facility]

## 2018-07-07 NOTE — Patient Instructions (Signed)
  1. Continue to Treat and prevent inflammation:   A.  Breo 100 one inhalation 3 times per week  B. OTC Rhinocort/Nasacort/Flonase 1-2 spray each nostril 1 time per day      2. If needed:   A. ProAir HFA 2 puffs every 4-6 hours  B. OTC antihistamine - Claritin/Zyrtec - one time per day  3. Return to clinic in 12 months or earlier if problem  4. Obtain fall flu vaccine

## 2018-07-08 ENCOUNTER — Encounter: Payer: Self-pay | Admitting: Allergy and Immunology

## 2019-08-11 ENCOUNTER — Other Ambulatory Visit: Payer: Self-pay | Admitting: Allergy and Immunology

## 2019-09-14 ENCOUNTER — Other Ambulatory Visit (HOSPITAL_COMMUNITY)
Admission: RE | Admit: 2019-09-14 | Discharge: 2019-09-14 | Disposition: A | Discharge status: Home / Self Care | Payer: 59 | Source: Ambulatory Visit | Attending: Family Medicine | Admitting: Family Medicine

## 2019-09-14 ENCOUNTER — Other Ambulatory Visit: Payer: Self-pay | Admitting: Family Medicine

## 2019-09-14 DIAGNOSIS — Z124 Encounter for screening for malignant neoplasm of cervix: Secondary | ICD-10-CM | POA: Diagnosis not present

## 2019-09-20 LAB — CYTOLOGY - PAP
Comment: NEGATIVE
Diagnosis: NEGATIVE
High risk HPV: NEGATIVE

## 2019-11-26 ENCOUNTER — Other Ambulatory Visit: Payer: Self-pay | Admitting: Allergy and Immunology

## 2019-12-13 ENCOUNTER — Encounter: Payer: Self-pay | Admitting: Allergy and Immunology

## 2019-12-13 ENCOUNTER — Ambulatory Visit: Payer: PRIVATE HEALTH INSURANCE | Admitting: Allergy and Immunology

## 2019-12-13 ENCOUNTER — Other Ambulatory Visit: Payer: Self-pay

## 2019-12-13 VITALS — BP 144/78 | HR 82 | Temp 97.8°F | Resp 20 | Ht 64.5 in | Wt 160.6 lb

## 2019-12-13 DIAGNOSIS — J3089 Other allergic rhinitis: Secondary | ICD-10-CM

## 2019-12-13 DIAGNOSIS — J4540 Moderate persistent asthma, uncomplicated: Secondary | ICD-10-CM | POA: Diagnosis not present

## 2019-12-13 DIAGNOSIS — D849 Immunodeficiency, unspecified: Secondary | ICD-10-CM

## 2019-12-13 DIAGNOSIS — J339 Nasal polyp, unspecified: Secondary | ICD-10-CM

## 2019-12-13 DIAGNOSIS — K219 Gastro-esophageal reflux disease without esophagitis: Secondary | ICD-10-CM

## 2019-12-13 MED ORDER — ALBUTEROL SULFATE HFA 108 (90 BASE) MCG/ACT IN AERS: INHALATION_SPRAY | RESPIRATORY_TRACT | 1 refills | Status: DC

## 2019-12-13 MED ORDER — BREO ELLIPTA 100-25 MCG/INH IN AEPB: INHALATION_SPRAY | RESPIRATORY_TRACT | 5 refills | Status: DC

## 2019-12-13 MED ORDER — OMEPRAZOLE 40 MG PO CPDR: DELAYED_RELEASE_CAPSULE | ORAL | 5 refills | Status: DC

## 2019-12-13 NOTE — Progress Notes (Signed)
[facility] - [place] - [place]   Follow-up Note  Referring Provider: Laurann Montana, MD Primary Provider: Laurann Montana, MD Date of Office Visit: 12/13/2019  Subjective:   Kelsey Hart (DOB: 1951-08-27) is a 69 y.o. female who [facility] on 12/13/2019 in re-evaluation of the following:  HPI: Kelsey Hart returns to this clinic in evaluation of asthma and allergic rhinoconjunctivitis and a history of nasal polyposis, and hoarseness believed to be secondary to LPR, and a history of immunosuppression with Humira for rheumatoid arthritis.  Her last visit to this clinic was 07 July 2018.  Kelsey Hart has had very good control of her asthma while utilizing Breo about 3 times per week without the need for systemic steroid to treat an exacerbation and rare use of a short acting bronchodilator and no limitation on ability to exercise.  Likewise her upper airway has really been doing quite well while intermittently using nasal steroid.  She has not required an antibiotic to treat an episode of sinusitis.  She still continues to have hoarseness and she thinks that this is actually a little bit worse since her last visit.  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  She continues to use Humira for her rheumatoid arthritis.  She did receive the flu vaccine.  Allergies as of 12/13/2019   No Known Allergies     Medication List    albuterol 108 (90 Base) MCG/ACT inhaler Commonly known as: ProAir HFA Inhale two puffs every four to six hours as needed for cough or wheeze.   Armour Thyroid 240 MG tablet Generic drug: thyroid TK 1 T PO ON AN EMPTY STOMACH ONCE A DAY FOR 30 DAYS   Breo Ellipta 100-25 MCG/INH Aepb Generic drug: fluticasone furoate-vilanterol INHALE 1 PUFF BY  MOUTH EVERY DAY AS DIRECTED. RINSE MOUTH AFTER USE   CRANBERRY PO Take 8,400 mg by mouth daily.   folic acid 800 MCG tablet Commonly known as: FOLVITE Take 800 mcg by mouth daily.   HUMIRA PEN [place] Inject 40 mg into the skin. Twice monthly.   loratadine 10 MG tablet Commonly known as: CLARITIN Take 1 tablet (10 mg total) by mouth daily as needed for allergies.   multivitamin tablet Take 1 tablet by mouth daily.   PROBIOTIC DAILY PO Take by mouth daily.   SAM-e 400 MG Tabs Take by mouth daily.   TURMERIC PO Take by mouth.   VITAMIN D PO Take 1,000 Units by mouth daily.       Past Medical History:  Diagnosis Date  . Allergy   . Arthritis    Rheumatoid  . Asthma   . Broken ankle 1980  . Chronic kidney disease   . SVT (supraventricular tachycardia) (HCC)   . Thyroid disease     Past Surgical History:  Procedure Laterality Date  . ANKLE SURGERY  1980  . FRACTURE SURGERY      Review of systems negative except as noted in HPI / PMHx or noted below:  Review of Systems  Constitutional: Negative.   HENT: Negative.   Eyes: Negative.   Respiratory: Negative.   Cardiovascular: Negative.   Gastrointestinal: Negative.   Genitourinary: Negative.   Musculoskeletal: Negative.   Skin: Negative.   Neurological: Negative.  Endo/Heme/Allergies: Negative.   Psychiatric/Behavioral: Negative.      Objective:   Vitals:   12/13/19 0933  BP: (!) 144/78  Pulse: 82  Resp: 20  Temp: 97.8 F (36.6 C)  SpO2: 98%   Height: 5' 4.5" (163.8 cm)  Weight: 160 lb 9.6 oz (72.8 kg)   Physical Exam Constitutional:      Appearance: She is not diaphoretic.     Comments: Raspy voice  HENT:     Head: Normocephalic.     Right Ear: Tympanic membrane, ear canal and external ear normal.     Left Ear: Tympanic membrane, ear canal and external ear normal.     Nose: Nose normal. No mucosal edema or rhinorrhea.     Mouth/Throat:     Pharynx: Uvula midline. No oropharyngeal  exudate.  Eyes:     Conjunctiva/sclera: Conjunctivae normal.  Neck:     Thyroid: No thyromegaly.     Trachea: Trachea normal. No tracheal tenderness or tracheal deviation.  Cardiovascular:     Rate and Rhythm: Normal rate and regular rhythm.     Heart sounds: Normal heart sounds, S1 normal and S2 normal. No murmur.  Pulmonary:     Effort: No respiratory distress.     Breath sounds: Normal breath sounds. No stridor. No wheezing or rales.  Lymphadenopathy:     Head:     Right side of head: No tonsillar adenopathy.     Left side of head: No tonsillar adenopathy.     Cervical: No cervical adenopathy.  Skin:    Findings: No erythema or rash.     Nails: There is no clubbing.  Neurological:     Mental Status: She is alert.     Diagnostics:    Spirometry was performed and demonstrated an FEV1 of 2.75 at 127 % of predicted.  The patient had an Asthma Control Test with the following results: ACT Total Score: 24.    Assessment and Plan:   1. Asthma, moderate persistent, well-controlled   2. Other allergic rhinitis   3. Nasal polyposis   4. LPRD (laryngopharyngeal reflux disease)   5. Immunosuppressed status ([place])     1. Continue to Treat and prevent inflammation:   A.  Breo 100 one inhalation 3-7 times per week depending on disease activity  B.  OTC Flonase 1-2 spray each nostril 3-7 times per week depending on disease activity  2. Treat reflux / LPR:   A. Consolidate caffiene  B. Omeprazole 40 mg - 1 tablet 1-2 times a day    3. If needed:   A. ProAir HFA 2 puffs every 4-6 hours  B. OTC antihistamine - Claritin/Zyrtec - one time per day  4. Return to clinic in 12 months or earlier if problem  5. Obtain Covid vaccine  6. ENT evaluation of throat?  Kelsey Hart appears to be doing very well on her current therapy which includes anti-inflammatory medications for her airway.  She does have a history very consistent with LPR and I did make some recommendations about treating  this issue as noted above.  I have offered her an evaluation with ENT in the past to look at her throat regarding her hoarseness and at this point it does not sound as though it is going to occur.  Assuming she does well with the plan noted above I will see her back in this clinic in 12 months or earlier if there is a problem.  Allena Katz, MD Allergy / Immunology [facility] 

## 2019-12-13 NOTE — Patient Instructions (Addendum)
  1. Continue to Treat and prevent inflammation:   A.  Breo 100 one inhalation 3-7 times per week depending on disease activity  B.  OTC Flonase 1-2 spray each nostril 3-7 times per week depending on disease activity  2. Treat reflux / LPR:   A. Consolidate caffiene  B. Omeprazole 40 mg - 1 tablet 1-2 times a day    3. If needed:   A. ProAir HFA 2 puffs every 4-6 hours  B. OTC antihistamine - Claritin/Zyrtec - one time per day  4. Return to clinic in 12 months or earlier if problem  5. Obtain Covid vaccine  6. ENT evaluation of throat?

## 2019-12-14 ENCOUNTER — Encounter: Payer: Self-pay | Admitting: Allergy and Immunology

## 2019-12-14 ENCOUNTER — Telehealth: Payer: Self-pay

## 2019-12-14 NOTE — Telephone Encounter (Signed)
PA has been submitted for Omeprazole PA Case ID: [id number] - Rx #: [id number] Information sent to [facility]

## 2019-12-15 NOTE — Telephone Encounter (Signed)
PA has been approved. PA form has been faxed to patient's pharmacy, labeled, and placed in bulk scanning.  

## 2020-01-06 ENCOUNTER — Ambulatory Visit: Payer: 59 | Attending: Internal Medicine

## 2020-01-06 DIAGNOSIS — Z23 Encounter for immunization: Secondary | ICD-10-CM

## 2020-01-06 NOTE — Progress Notes (Signed)
   Covid-19 Vaccination Clinic  Name:  Kelsey Hart    MRN: [id number] DOB: October 31, 1951  01/06/2020  [name] was observed post Covid-19 immunization for 30 minutes based on pre-vaccination screening without incidence. She was provided with Vaccine Information Sheet and instruction to access the V-Safe system.   [name] was instructed to call 911 with any severe reactions post vaccine: Marland Kitchen Difficulty breathing  . Swelling of your face and throat  . A fast heartbeat  . A bad rash all over your body  . Dizziness and weakness    Immunizations Administered    Name Date Dose VIS Date Route   Pfizer COVID-19 Vaccine 01/06/2020  3:12 PM 0.3 mL 10/21/2019 Intramuscular   Manufacturer: [employer], [employer]   Lot: [id number]   NDC: [address]-3

## 2020-02-01 ENCOUNTER — Ambulatory Visit: Payer: 59 | Attending: Internal Medicine

## 2020-02-01 DIAGNOSIS — Z23 Encounter for immunization: Secondary | ICD-10-CM

## 2020-02-01 NOTE — Progress Notes (Signed)
   Covid-19 Vaccination Clinic  Name:  Kelsey Hart    MRN: [id number] DOB: [birth year]  02/01/2020  [name] was observed post Covid-19 immunization for 15 minutes without incident. She was provided with Vaccine Information Sheet and instruction to access the V-Safe system.   [name] was instructed to call 911 with any severe reactions post vaccine: Marland Kitchen Difficulty breathing  . Swelling of face and throat  . A fast heartbeat  . A bad rash all over body  . Dizziness and weakness   Immunizations Administered    Name Date Dose VIS Date Route   Pfizer COVID-19 Vaccine 02/01/2020  9:06 AM 0.3 mL 10/21/2019 Intramuscular   Manufacturer: [employer], [employer]   Lot: [id number]   NDC: [address]-6

## 2020-06-24 ENCOUNTER — Other Ambulatory Visit: Payer: Self-pay | Admitting: Allergy and Immunology

## 2020-08-07 ENCOUNTER — Other Ambulatory Visit: Payer: 59

## 2020-08-07 DIAGNOSIS — T7632XA Child psychological abuse, suspected, initial encounter: Secondary | ICD-10-CM

## 2020-08-08 LAB — NOVEL CORONAVIRUS, NAA: SARS-CoV-2, NAA: NOT DETECTED

## 2020-08-08 LAB — SARS-COV-2, NAA 2 DAY TAT

## 2020-12-18 ENCOUNTER — Ambulatory Visit (INDEPENDENT_AMBULATORY_CARE_PROVIDER_SITE_OTHER): Payer: Medicare Other | Admitting: Family

## 2020-12-18 ENCOUNTER — Ambulatory Visit: Payer: PRIVATE HEALTH INSURANCE | Admitting: Allergy and Immunology

## 2020-12-18 ENCOUNTER — Other Ambulatory Visit: Payer: Self-pay

## 2020-12-18 ENCOUNTER — Encounter: Payer: Self-pay | Admitting: Family

## 2020-12-18 VITALS — BP 140/80 | HR 53 | Temp 97.9°F | Resp 14 | Ht 64.0 in | Wt 160.0 lb

## 2020-12-18 DIAGNOSIS — J4540 Moderate persistent asthma, uncomplicated: Secondary | ICD-10-CM | POA: Diagnosis not present

## 2020-12-18 DIAGNOSIS — J3089 Other allergic rhinitis: Secondary | ICD-10-CM

## 2020-12-18 DIAGNOSIS — J339 Nasal polyp, unspecified: Secondary | ICD-10-CM

## 2020-12-18 DIAGNOSIS — D849 Immunodeficiency, unspecified: Secondary | ICD-10-CM

## 2020-12-18 DIAGNOSIS — K219 Gastro-esophageal reflux disease without esophagitis: Secondary | ICD-10-CM | POA: Diagnosis not present

## 2020-12-18 MED ORDER — BREO ELLIPTA 100-25 MCG/INH IN AEPB
INHALATION_SPRAY | RESPIRATORY_TRACT | 5 refills | Status: DC
Start: 2020-12-18 — End: 2023-03-02

## 2020-12-18 MED ORDER — ALBUTEROL SULFATE HFA 108 (90 BASE) MCG/ACT IN AERS
INHALATION_SPRAY | RESPIRATORY_TRACT | 2 refills | Status: DC
Start: 2020-12-18 — End: 2022-01-21

## 2020-12-18 MED ORDER — OMEPRAZOLE 40 MG PO CPDR
DELAYED_RELEASE_CAPSULE | ORAL | 1 refills | Status: DC
Start: 2020-12-18 — End: 2022-01-21

## 2020-12-18 MED ORDER — LORATADINE 10 MG PO TABS: 10.0000 mg | ORAL_TABLET | Freq: Every day | ORAL | 2 refills | Status: DC | PRN

## 2020-12-18 NOTE — Patient Instructions (Signed)
Moderate persistent asthma Continue Breo 100 mcg 1 puff once a day 3-7 times per week depending on disease activity. May use albuterol 2 puffs every 4 hours as needed for cough, wheeze, tightness in chest, shortness of breath.  Allergic rhinitis Continue Flonase 1 to 2 sprays each nostril 3-7 times per week depending on disease activity. Hold off on using this nose spray for a few days to let your left nostril heal May use an antihistamine such as Claritin or Zyrtec once a day as needed for runny nose or itching  Reflux Continue dietary and lifestyle modifications Change omeprazole 40 mg 1 tablet 1-2 times a day as  Hoarseness Consider ENT evaluation when ready  Please let us know if this treatment plan is not working well for you Schedule a follow-up appointment in 1 year or sooner if needed

## 2020-12-18 NOTE — Progress Notes (Signed)
[address] [place] [address] Dept: [phone]  FOLLOW UP NOTE  Patient ID: Kelsey Hart, female    DOB: May 12, 1951  Age: 70 y.o. MRN: [id number] Date of Office Visit: 12/18/2020  Assessment  Chief Complaint: Asthma (No changes. )  HPI Kelsey Hart is a 70 year old female who presents today for follow-up of moderate persistent asthma, allergic rhinoconjunctivitis, history of nasal polyposis, hoarseness believed to be secondary to laryngopharyngeal reflux, and a history of immunosuppression with Humira for rheumatoid arthritis.  She was last seen on December 13, 2019 by Dr. Lucie Leather.  Moderate persistent asthma is reported as controlled with Breo 100 mcg 1 puff once a day every Monday, Wednesday, Friday and albuterol as needed.  She reports occasional tightness in her chest and denies any coughing, wheezing, shortness of breath, and nocturnal awakenings.  Since her last office visit she has not required any systemic steroids or made any trips to the emergency room or urgent care due to breathing problems.  She reports that she has never used her albuterol inhaler.  Allergic rhinitis is reported as moderately controlled with an over-the-counter antihistamine as needed and fluticasone nasal spray as needed.  She reports occasional clear rhinorrhea, nasal congestion, and occasional postnasal drip.  She just picked up a new set of fluticasone nasal spray.  She has not been using this medication for a while.  She reports that she now has 5 cats at home and they do sleep in her house at night.  She feels that this is the reason for her allergy symptoms.  She continues to have hoarseness, but reports that it is moderately better with omeprazole 40 mg once a day.  At this time she does not wish to see ENT, but reports that soon as Covid is over she will go see ENT.  Instructed her to let us know if she changes her mind.  She continues to use Humira for her rheumatoid arthritis.  She  has received 3  COVID-19 vaccines and also received her flu vaccine this year.    Drug Allergies:  No Known Allergies  Review of Systems: Review of Systems  Constitutional: Negative for chills and fever.  HENT:       Reports occasional clear rhinorrhea, nasal congestion, and occasional postnasal drip  Eyes:       Reports occasional itchy watery eyes.  She uses an over-the-counter eyedrop and this helps  Respiratory: Negative for cough, shortness of breath and wheezing.        Reports occasional tightness in her chest  Cardiovascular: Negative for chest pain and palpitations.  Gastrointestinal: Negative for abdominal pain and heartburn.  Genitourinary: Negative for dysuria.  Skin: Negative for itching and rash.  Neurological: Negative for headaches.  Endo/Heme/Allergies: Positive for environmental allergies.    Physical Exam: BP 140/80   Pulse (!) 53   Temp 97.9 F (36.6 C)   Resp 14   Ht 5\' 4"  (1.626 m)   Wt 160 lb (72.6 kg)   SpO2 98%   BMI 27.46 kg/m    Physical Exam Constitutional:      Appearance: Normal appearance.  HENT:     Head: Normocephalic and atraumatic.     Comments: Pharynx normal, eyes normal, ears normal, nose bilateral lower turbinates moderately edematous and slightly erythematous edematous with no drainage noted.  Irritation noted in left nostril    Right Ear: Tympanic membrane, ear canal and external ear normal.     Left Ear: Tympanic membrane,  ear canal and external ear normal.     Mouth/Throat:     Mouth: Mucous membranes are moist.     Pharynx: Oropharynx is clear.  Eyes:     Conjunctiva/sclera: Conjunctivae normal.  Cardiovascular:     Rate and Rhythm: Regular rhythm.     Heart sounds: Normal heart sounds.  Pulmonary:     Effort: Pulmonary effort is normal.     Breath sounds: Normal breath sounds.     Comments: Lungs clear to auscultation Musculoskeletal:     Cervical back: Neck supple.  Skin:    General: Skin is warm.  Neurological:      Mental Status: She is alert and oriented to person, place, and time.  Psychiatric:        Mood and Affect: Mood normal.        Behavior: Behavior normal.        Thought Content: Thought content normal.        Judgment: Judgment normal.     Diagnostics: FVC 3.45 L, FEV1 2.75 L.  Predicted FVC 3.09 L, FEV1 2.35 L.  Spirometry indicates normal ventilatory function.  Assessment and Plan: 1. Asthma, moderate persistent, well-controlled   2. Other allergic rhinitis   3. Nasal polyposis   4. LPRD (laryngopharyngeal reflux disease)   5. Immunosuppressed status (HCC)     No orders of the defined types were placed in this encounter.   Patient Instructions  Moderate persistent asthma Continue Breo 100 mcg 1 puff once a day 3-7 times per week depending on disease activity. May use albuterol 2 puffs every 4 hours as needed for cough, wheeze, tightness in chest, shortness of breath.  Allergic rhinitis Continue Flonase 1 to 2 sprays each nostril 3-7 times per week depending on disease activity. Hold off on using this nose spray for a few days to let your left nostril heal May use an antihistamine such as Claritin or Zyrtec once a day as needed for runny nose or itching  Reflux Continue dietary and lifestyle modifications Change omeprazole 40 mg 1 tablet 1-2 times a day as  Hoarseness Consider ENT evaluation when ready  Please let us know if this treatment plan is not working well for you Schedule a follow-up appointment in 1 year or sooner if needed   Return in about 1 year (around 12/18/2021), or if symptoms worsen or fail to improve.    Thank you for the opportunity to care for this patient.  Please do not hesitate to contact me with questions.  Nehemiah Settle, FNP [facility]

## 2021-03-18 ENCOUNTER — Telehealth: Payer: Self-pay

## 2021-03-18 NOTE — Telephone Encounter (Signed)
Patient called and stated that her Kelsey Hart is now costing her over $200 after insurance. Patient wants to know what other medications can substitute the Breo that are cost efficient.

## 2021-03-18 NOTE — Telephone Encounter (Signed)
Please sort through what insurance coverage is available that is cheaper than Breo that contains a inhaled steroid and a long-acting bronchodilator.  That would be a combination inhaler.

## 2021-03-19 ENCOUNTER — Other Ambulatory Visit: Payer: Self-pay

## 2021-03-19 MED ORDER — FLUTICASONE-SALMETEROL 250-50 MCG/ACT IN AEPB: INHALATION_SPRAY | RESPIRATORY_TRACT | 1 refills | Status: DC

## 2021-03-19 NOTE — Telephone Encounter (Signed)
pts insurance covers advair hfa at tier 3 (higher deductible), also covers anoro at a tier 3 and breztri at a tier 3. Im not exactly sure what the prices would be but this is what is on the formulary.

## 2021-03-19 NOTE — Telephone Encounter (Signed)
Informed pt of the change and directions pt stated understanding, verified pharmacy and sent in new rx

## 2021-03-19 NOTE — Telephone Encounter (Signed)
Advair 250 disk - 1 inhalation 1 or 2 times per day depending on disease activity.

## 2021-06-27 NOTE — Telephone Encounter (Signed)
Patient paid a $42 copay at the pharmacy for Advair and now insurance is charging her $350 from 03/19/2021. Patient is not sure if there is anything we can do, but she would like to speak to a nurse about it.

## 2021-12-24 ENCOUNTER — Ambulatory Visit: Payer: Medicare Other | Admitting: Allergy and Immunology

## 2022-01-21 ENCOUNTER — Other Ambulatory Visit: Payer: Self-pay

## 2022-01-21 ENCOUNTER — Encounter: Payer: Self-pay | Admitting: Allergy and Immunology

## 2022-01-21 ENCOUNTER — Ambulatory Visit (INDEPENDENT_AMBULATORY_CARE_PROVIDER_SITE_OTHER): Payer: Medicare Other | Admitting: Allergy and Immunology

## 2022-01-21 VITALS — BP 142/78 | HR 68 | Temp 97.3°F | Resp 18 | Ht 64.0 in | Wt 165.0 lb

## 2022-01-21 DIAGNOSIS — K219 Gastro-esophageal reflux disease without esophagitis: Secondary | ICD-10-CM | POA: Diagnosis not present

## 2022-01-21 DIAGNOSIS — J3089 Other allergic rhinitis: Secondary | ICD-10-CM

## 2022-01-21 DIAGNOSIS — J4540 Moderate persistent asthma, uncomplicated: Secondary | ICD-10-CM | POA: Diagnosis not present

## 2022-01-21 MED ORDER — FLUTICASONE PROPIONATE 50 MCG/ACT NA SUSP: 2.0000 | Freq: Every day | NASAL | 11 refills | Status: DC

## 2022-01-21 MED ORDER — LORATADINE 10 MG PO TABS: 10.0000 mg | ORAL_TABLET | Freq: Two times a day (BID) | ORAL | 3 refills | Status: DC | PRN

## 2022-01-21 MED ORDER — OMEPRAZOLE 40 MG PO CPDR: 40.0000 mg | DELAYED_RELEASE_CAPSULE | Freq: Two times a day (BID) | ORAL | 3 refills | Status: DC | PRN

## 2022-01-21 MED ORDER — ALBUTEROL SULFATE HFA 108 (90 BASE) MCG/ACT IN AERS: 2.0000 | INHALATION_SPRAY | RESPIRATORY_TRACT | 2 refills | Status: DC | PRN

## 2022-01-21 MED ORDER — FLUTICASONE-SALMETEROL 250-50 MCG/ACT IN AEPB: 1.0000 | INHALATION_SPRAY | Freq: Two times a day (BID) | RESPIRATORY_TRACT | 11 refills | Status: DC

## 2022-01-21 NOTE — Patient Instructions (Addendum)
?  1.  Advair 100 Diskus -1 inhalation 1-2 times per day depending on disease activity (replaces Breo) ? ?2.  Flonase -1-2 sprays each nostril 3-7 times per week depending on disease activity ? ?3.  Omeprazole 40 mg -1 tablet 1-2 times per day depending on disease activity ? ?4.  If needed: ? ?A.  Loratadine 10 mg -1 tablet 1 time per day ?B.  Albuterol HFA -1 inhalation 1 time per day ? ?4.  Return to clinic in 1 year or earlier if problem ?

## 2022-01-21 NOTE — Progress Notes (Signed)
? ?[place] [facility] [employer] [facility] [place] - [place] - [place] ? ? ?Follow-up Note ? ?Referring Provider: Laurann Montana, MD ?Primary Provider: Laurann Montana, MD ?Date of Office Visit: 01/21/2022 ? ?Subjective:  ? ?Kelsey Hart (DOB: 1950-12-27) is a 71 y.o. female who [facility] in re-evaluation of the following: ? ?HPI: Kelsey Hart returns to this clinic in reevaluation of asthma, allergic rhinoconjunctivitis, history of nasal polyposis, LPR, in the context of immunosuppression with Humira for rheumatoid arthritis.  She was last seen in our clinic by her nurse practitioner on 18 December 2020. ? ?She has really done well with her asthma.  She has not required a systemic steroid to treat an exacerbation.  She rarely uses a short acting bronchodilator.  She uses her Breo about 3 times per week.  Virgel Bouquet has become too expensive to use as her insurance coverage is awful regarding this medication. ? ?Likewise her nose has really been doing quite well.  She has not required an antibiotic to treat an episode of sinusitis.  She uses her nasal steroid about 3 times a week. ? ?She continues to use omeprazole to treat reflux. ? ?She tells me that she had some issues with SVT and was recently started on Lopressor in 2023 which has resulted in control of this issue. ? ?She has received 4 COVID vaccines, was infected with COVID as an asymptomatic infection, and has received this year's flu vaccine. ? ?Allergies as of 01/21/2022   ?No Known Allergies ?  ? ?  ?Medication List  ? ? ?albuterol 108 (90 Base) MCG/ACT inhaler ?Commonly known as: ProAir HFA ?Inhale two puffs every four to six hours as needed for cough or wheeze. ?  ?Armour Thyroid 240 MG tablet ?Generic drug: thyroid ?TK 1 T PO ON AN EMPTY STOMACH ONCE A DAY FOR 30 DAYS ?  ?Breo Ellipta 100-25 MCG/ACT Aepb ?Generic drug: fluticasone furoate-vilanterol ?INHALE 1 PUFF BY MOUTH EVERY DAY AS DIRECTED. RINSE MOUTH AFTER USE ?  ?CRANBERRY  PO ?Take 8,400 mg by mouth daily. ?  ?fluticasone-salmeterol 250-50 MCG/ACT Aepb ?Commonly known as: Advair Diskus ?1 inhalation 1-2 times per day depending on disease activity ?  ?folic acid 800 MCG tablet ?Commonly known as: FOLVITE ?Take 800 mcg by mouth daily. ?  ?HUMIRA PEN [place] ?Inject 40 mg into the skin. Twice monthly. ?  ?loratadine 10 MG tablet ?Commonly known as: CLARITIN ?Take 1 tablet (10 mg total) by mouth daily as needed for allergies. ?  ?metoprolol tartrate 25 MG tablet ?Commonly known as: LOPRESSOR ?Take 25 mg by mouth 2 (two) times daily as needed. As needed ?  ?multivitamin tablet ?Take 1 tablet by mouth daily. ?  ?omeprazole 40 MG capsule ?Commonly known as: PRILOSEC ?TAKE 1 CAPSULE BY MOUTH 1 TO 2 TIMES DAILY ?  ?PROBIOTIC DAILY PO ?Take by mouth daily. ?  ?SAM-e 400 MG Tabs ?Take by mouth daily. ?  ?TURMERIC PO ?Take by mouth. ?  ?VITAMIN D PO ?Take 1,000 Units by mouth daily. ?  ? ?Past Medical History:  ?Diagnosis Date  ? Allergy   ? Arthritis   ? Rheumatoid  ? Asthma   ? Broken ankle 1980  ? Chronic kidney disease   ? SVT (supraventricular tachycardia) (HCC)   ? Thyroid disease   ? ? ?Past Surgical History:  ?Procedure Laterality Date  ? ANKLE SURGERY  1980  ? FRACTURE SURGERY    ? ? ?Review of systems negative except as noted  in HPI / PMHx or noted below: ? ?Review of Systems  ?Constitutional: Negative.   ?HENT: Negative.    ?Eyes: Negative.   ?Respiratory: Negative.    ?Cardiovascular: Negative.   ?Gastrointestinal: Negative.   ?Genitourinary: Negative.   ?Musculoskeletal: Negative.   ?Skin: Negative.   ?Neurological: Negative.   ?Endo/Heme/Allergies: Negative.   ?Psychiatric/Behavioral: Negative.    ? ? ?Objective:  ? ?Vitals:  ? 01/21/22 1349  ?BP: (!) 142/78  ?Pulse: 68  ?Resp: 18  ?Temp: (!) 97.3 ?F (36.3 ?C)  ?SpO2: 97%  ? ?Height: 5\' 4"  (162.6 cm)  ?Weight: 165 lb (74.8 kg)  ? ?Physical Exam ?Constitutional:   ?   Appearance: She is not diaphoretic.  ?HENT:  ?   Head: Normocephalic.   ?   Right Ear: Tympanic membrane, ear canal and external ear normal.  ?   Left Ear: Tympanic membrane, ear canal and external ear normal.  ?   Nose: Nose normal. No mucosal edema or rhinorrhea.  ?   Mouth/Throat:  ?   Pharynx: Uvula midline. No oropharyngeal exudate.  ?Eyes:  ?   Conjunctiva/sclera: Conjunctivae normal.  ?Neck:  ?   Thyroid: No thyromegaly.  ?   Trachea: Trachea normal. No tracheal tenderness or tracheal deviation.  ?Cardiovascular:  ?   Rate and Rhythm: Normal rate and regular rhythm.  ?   Heart sounds: Normal heart sounds, S1 normal and S2 normal. No murmur heard. ?Pulmonary:  ?   Effort: No respiratory distress.  ?   Breath sounds: Normal breath sounds. No stridor. No wheezing or rales.  ?Lymphadenopathy:  ?   Head:  ?   Right side of head: No tonsillar adenopathy.  ?   Left side of head: No tonsillar adenopathy.  ?   Cervical: No cervical adenopathy.  ?Skin: ?   Findings: No erythema or rash.  ?   Nails: There is no clubbing.  ?Neurological:  ?   Mental Status: She is alert.  ? ? ?Diagnostics:  ?  ?Spirometry was performed and demonstrated an FEV1 of 2.38 at 107 % of predicted. ? ?Assessment and Plan:  ? ?1. Asthma, moderate persistent, well-controlled   ?2. Other allergic rhinitis   ?3. LPRD (laryngopharyngeal reflux disease)   ? ?1.  Advair 100 Diskus -1 inhalation 1-2 times per day depending on disease activity (replaces Breo) ? ?2.  Flonase -1-2 sprays each nostril 3-7 times per week depending on disease activity ? ?3.  Omeprazole 40 mg -1 tablet 1-2 times per day depending on disease activity ? ?4.  If needed: ? ?A.  Loratadine 10 mg -1 tablet 1 time per day ?B.  Albuterol HFA -1 inhalation 1 time per day ? ?4.  Return to clinic in 1 year or earlier if problem ? ?Terrea has a very good idea about her disease state involving her respiratory tract and the insults from both inflammation and reflux and has a good understanding of how to use the medications and appropriate dosing of her  medications depending on disease activity.  Assuming she continues to do well on this plan I will see her back in this clinic in 1 year or earlier if there is a problem. ? ?[name], MD ?Allergy / Immunology ?[facility] ?

## 2022-01-22 ENCOUNTER — Encounter: Payer: Self-pay | Admitting: Allergy and Immunology

## 2022-08-10 NOTE — Progress Notes (Unsigned)
Name: Kelsey Hart  MRN/ DOB: [id number], 07/28/1951    Age/ Sex: 71 y.o., female    PCP: Harlan Stains, MD   Reason for Endocrinology Evaluation: Hypothyroidism     Date of Initial Endocrinology Evaluation: 08/11/2022     HPI: [name] is a 71 y.o. female with a past medical history of RA, anxiety/stress and SVT. The patient presented for initial endocrinology clinic visit on 08/11/2022 for consultative assistance with her Hypothyroidism.   Pt has been diagnosed with hypothyroidism 1993 She  was on Synthroid initially but was not happy with it and was switched to Armour thyroid   She has been noted with unintentional weight loss  Denies loose stools unless food related , denies constipation  Has SVT , with palpations Denies tremors  Denies Biotin  She also takes   NO Fh of thyroid disease   Pt on Armour thyroid 180 mg daily  Armour  thyroid 90 mg Monday, Wednesday and Friday      HISTORY:  Past Medical History:  Past Medical History:  Diagnosis Date   Allergy    Arthritis    Rheumatoid   Asthma    Broken ankle 1980   Chronic kidney disease    SVT (supraventricular tachycardia)    Thyroid disease    Past Surgical History:  Past Surgical History:  Procedure Laterality Date   [place] History:  reports that she has never smoked. She has never used smokeless tobacco. She reports that she does not drink alcohol and does not use drugs. Family History: family history includes Dementia in her maternal grandmother and mother; Diabetes in her maternal grandmother and maternal uncle; Heart disease in her father and paternal grandmother; Stroke in her maternal grandfather and paternal grandfather.   HOME MEDICATIONS: Allergies as of 08/11/2022       Reactions   Cat Hair Extract    Levonorgestrel-ethinyl Estrad Other (See Comments)        Medication List        Accurate as of August 11, 2022  1:04 PM. If  you have any questions, ask your nurse or doctor.          albuterol 108 (90 Base) MCG/ACT inhaler Commonly known as: ProAir HFA Inhale 2 puffs into the lungs every 4 (four) hours as needed for wheezing or shortness of breath. Inhale two puffs every four to six hours as needed for cough or wheeze.   Breo Ellipta 100-25 MCG/ACT Aepb Generic drug: fluticasone furoate-vilanterol INHALE 1 PUFF BY MOUTH EVERY DAY AS DIRECTED. RINSE MOUTH AFTER USE   CRANBERRY PO Take 8,400 mg by mouth daily.   fluticasone 50 MCG/ACT nasal spray Commonly known as: FLONASE Place 2 sprays into both nostrils daily.   fluticasone-salmeterol 250-50 MCG/ACT Aepb Commonly known as: Advair Diskus Inhale 1 puff into the lungs in the morning and at bedtime. 1 inhalation 1-2 times per day depending on disease activity   folic acid 782 MCG tablet Commonly known as: FOLVITE Take 800 mcg by mouth daily.   HUMIRA PEN [place] Inject 40 mg into the skin. Twice monthly.   loratadine 10 MG tablet Commonly known as: CLARITIN Take 1 tablet (10 mg total) by mouth 2 (two) times daily as needed for allergies (Can take an extra dose during flare ups.).   METOPROLOL SUCCINATE ER PO Take 12.5 mg by mouth daily.   metoprolol tartrate 25 MG  tablet Commonly known as: LOPRESSOR Take 25 mg by mouth 2 (two) times daily as needed. As needed   multivitamin tablet Take 1 tablet by mouth daily.   omeprazole 40 MG capsule Commonly known as: PRILOSEC Take 1 capsule (40 mg total) by mouth 2 (two) times daily as needed. TAKE 1 CAPSULE BY MOUTH 1 TO 2 TIMES DAILY   PROBIOTIC DAILY PO Take by mouth daily.   SAM-e 400 MG Tabs Take by mouth daily.   thyroid 180 MG tablet Commonly known as: ARMOUR Take 180 mg by mouth daily. 90mg  Mon-Wed-Fri 180 daily What changed: Another medication with the same name was removed. Continue taking this medication, and follow the directions you see here. Changed by: , MD    TURMERIC PO Take by mouth.   VITAMIN D PO Take 1,000 Units by mouth daily.          REVIEW OF SYSTEMS: A comprehensive ROS was conducted with the patient and is negative except as per HPI    OBJECTIVE:  VS: BP 110/70 (BP Location: Left Arm, Patient Position: Sitting, Cuff Size: Small)   Pulse 69   Ht 5\' 4"  (1.626 m)   Wt 157 lb (71.2 kg)   SpO2 95%   BMI 26.95 kg/m    Wt Readings from Last 3 Encounters:  08/11/22 157 lb (71.2 kg)  01/21/22 165 lb (74.8 kg)  12/18/20 160 lb (72.6 kg)     EXAM: General: Pt appears well and is in NAD  Eyes: External eye exam normal without stare, lid lag or exophthalmos.  EOM intact.  PERRL.  Neck: General: Supple without adenopathy. Thyroid: Thyroid size normal.  No goiter or nodules appreciated.  Lungs: Clear with good BS bilat with no rales, rhonchi, or wheezes  Heart: Auscultation: RRR.  Abdomen: Normoactive bowel sounds, soft, nontender, without masses or organomegaly palpable  Extremities:  BL LE: No pretibial edema normal ROM and strength.  Mental Status: Judgment, insight: Intact Orientation: Oriented to time, place, and person Mood and affect: No depression, anxiety, or agitation     DATA REVIEWED:  Latest Reference Range & Units 08/11/22 13:49  TSH 0.35 - 5.50 uIU/mL 1.55       04/21/2022 TSH 6.8 uIU/mL   ASSESSMENT/PLAN/RECOMMENDATIONS:   Hypothyroidism :  -Patient is clinically euthyroid -No local neck symptoms -She used to be on Synthroid but did not like its effect I would like to stay on Armour Thyroid -She is currently on Armour Thyroid 180 mg daily , PLUS 90 mg 3 days a week -I suspect she has absorption issues hands elevated TSH in June, I did advise the patient to separate Armour Thyroid by 30 minutes from PPI and food.  She was also advised to separate vitamins by 4 hours from Armour Thyroid - TSH normal , no changes   Medications : Continue Armour thyroid 180 mg daily  Continue Armour thyroid 90  mg Monday, Wednesday and Friday     F/U in 4 months   Labs in 2 months   Signed electronically by: Friday, MD  [facility] Endocrinology  [facility] [address] [place] [place], [place] Phone: [phone] FAX: [phone]   CC: [phone], MD [phone] Laurann Montana [address] [place] Phone: [phone] Fax: [phone]   Return to Endocrinology clinic as below: No future appointments.

## 2022-08-11 ENCOUNTER — Encounter: Payer: Self-pay | Admitting: Internal Medicine

## 2022-08-11 ENCOUNTER — Ambulatory Visit (INDEPENDENT_AMBULATORY_CARE_PROVIDER_SITE_OTHER): Payer: Medicare Other | Admitting: Internal Medicine

## 2022-08-11 VITALS — BP 110/70 | HR 69 | Ht 64.0 in | Wt 157.0 lb

## 2022-08-11 DIAGNOSIS — E039 Hypothyroidism, unspecified: Secondary | ICD-10-CM | POA: Diagnosis not present

## 2022-08-11 LAB — TSH: TSH: 1.55 u[IU]/mL (ref 0.35–5.50)

## 2022-08-12 MED ORDER — THYROID 180 MG PO TABS: 180.0000 mg | ORAL_TABLET | Freq: Every day | ORAL | 2 refills | Status: DC

## 2022-08-12 MED ORDER — THYROID 90 MG PO TABS: 90.0000 mg | ORAL_TABLET | ORAL | 2 refills | Status: DC

## 2022-10-11 ENCOUNTER — Encounter: Payer: Self-pay | Admitting: Internal Medicine

## 2022-10-15 ENCOUNTER — Other Ambulatory Visit (INDEPENDENT_AMBULATORY_CARE_PROVIDER_SITE_OTHER): Payer: Medicare Other

## 2022-10-15 DIAGNOSIS — E039 Hypothyroidism, unspecified: Secondary | ICD-10-CM

## 2022-10-15 LAB — TSH: TSH: 4.34 u[IU]/mL (ref 0.35–5.50)

## 2022-12-12 ENCOUNTER — Encounter: Payer: Self-pay | Admitting: Internal Medicine

## 2022-12-12 ENCOUNTER — Ambulatory Visit (INDEPENDENT_AMBULATORY_CARE_PROVIDER_SITE_OTHER): Payer: Medicare Other | Admitting: Internal Medicine

## 2022-12-12 VITALS — BP 126/80 | HR 59 | Ht 64.0 in | Wt 157.0 lb

## 2022-12-12 DIAGNOSIS — E039 Hypothyroidism, unspecified: Secondary | ICD-10-CM

## 2022-12-12 LAB — TSH: TSH: 0.12 u[IU]/mL — ABNORMAL LOW (ref 0.35–5.50)

## 2022-12-12 MED ORDER — THYROID 180 MG PO TABS: 180.0000 mg | ORAL_TABLET | ORAL | 2 refills | Status: DC

## 2022-12-12 NOTE — Progress Notes (Signed)
Name: Kelsey Hart  MRN/ DOB: [id number], 01/08/1951    Age/ Sex: 72 y.o., female    PCP: Harlan Stains, MD   Reason for Endocrinology Evaluation: Hypothyroidism     Date of Initial Endocrinology Evaluation: 08/11/2022    HPI: [name] is a 72 y.o. female with a past medical history of RA, anxiety/stress and SVT. The patient presented for initial endocrinology clinic visit on 08/11/2022  for consultative assistance with her Hypothyroidism.   Pt has been diagnosed with hypothyroidism 1993 She  was on Synthroid initially but was not happy with it and was switched to Armour thyroid     NO Fh of thyroid disease    SUBJECTIVE:    Today (12/12/22):  [name] is here for a follow up on hypothyroidism.   Weight has  been stable since 08/2022 She switched Armour thyroid to night which made her feel better  She is having issues with the pharmacy dispensing her prescription  Denies local neck swelling  Denies constipation or constipation  Has SVT , has had occasional palpitations  Denies Biotin    Armour thyroid 180 mg daily  Armour thyroid 90 mg Monday, Wednesday and Friday       HISTORY:  Past Medical History:  Past Medical History:  Diagnosis Date   Allergy    Arthritis    Rheumatoid   Asthma    Broken ankle 1980   Chronic kidney disease    SVT (supraventricular tachycardia)    Thyroid disease    Past Surgical History:  Past Surgical History:  Procedure Laterality Date   [place]      Social History:  reports that she has never smoked. She has never used smokeless tobacco. She reports that she does not drink alcohol and does not use drugs. Family History: family history includes Dementia in her maternal grandmother and mother; Diabetes in her maternal grandmother and maternal uncle; Heart disease in her father and paternal grandmother; Stroke in her maternal grandfather and paternal grandfather.   HOME  MEDICATIONS: Allergies as of 12/12/2022       Reactions   Cat Hair Extract    Levonorgestrel-ethinyl Estrad Other (See Comments)        Medication List        Accurate as of December 12, 2022 10:34 AM. If you have any questions, ask your nurse or doctor.          STOP taking these medications    METOPROLOL SUCCINATE ER PO Stopped by: Dorita Sciara, MD       TAKE these medications    albuterol 108 (90 Base) MCG/ACT inhaler Commonly known as: ProAir HFA Inhale 2 puffs into the lungs every 4 (four) hours as needed for wheezing or shortness of breath. Inhale two puffs every four to six hours as needed for cough or wheeze.   Breo Ellipta 100-25 MCG/ACT Aepb Generic drug: fluticasone furoate-vilanterol INHALE 1 PUFF BY MOUTH EVERY DAY AS DIRECTED. RINSE MOUTH AFTER USE   CRANBERRY PO Take 8,400 mg by mouth daily.   fluticasone 50 MCG/ACT nasal spray Commonly known as: FLONASE Place 2 sprays into both nostrils daily.   fluticasone-salmeterol 250-50 MCG/ACT Aepb Commonly known as: Advair Diskus Inhale 1 puff into the lungs in the morning and at bedtime. 1 inhalation 1-2 times per day depending on disease activity   folic acid 191 MCG tablet Commonly known as: FOLVITE Take 800 mcg by mouth  daily.   HUMIRA PEN [place] Inject 40 mg into the skin. Twice monthly.   loratadine 10 MG tablet Commonly known as: CLARITIN Take 1 tablet (10 mg total) by mouth 2 (two) times daily as needed for allergies (Can take an extra dose during flare ups.).   metoprolol tartrate 25 MG tablet Commonly known as: LOPRESSOR Take 25 mg by mouth 2 (two) times daily as needed. As needed   multivitamin tablet Take 1 tablet by mouth daily.   omeprazole 40 MG capsule Commonly known as: PRILOSEC Take 1 capsule (40 mg total) by mouth 2 (two) times daily as needed. TAKE 1 CAPSULE BY MOUTH 1 TO 2 TIMES DAILY   PROBIOTIC DAILY PO Take by mouth daily.   SAM-e 400 MG Tabs Take by mouth  daily.   thyroid 90 MG tablet Commonly known as: ARMOUR Take 1 tablet (90 mg total) by mouth as directed. 3 days a week in addition to armour 180 mg   thyroid 180 MG tablet Commonly known as: ARMOUR Take 1 tablet (180 mg total) by mouth daily.   TURMERIC PO Take by mouth.   VITAMIN D PO Take 1,000 Units by mouth daily.          REVIEW OF SYSTEMS: A comprehensive ROS was conducted with the patient and is negative except as per HPI    OBJECTIVE:  VS: BP 126/80 (BP Location: Left Arm, Patient Position: Sitting, Cuff Size: Small)   Pulse (!) 59   Ht 5\' 4"  (1.626 m)   Wt 157 lb (71.2 kg)   SpO2 99%   BMI 26.95 kg/m    Wt Readings from Last 3 Encounters:  12/12/22 157 lb (71.2 kg)  08/11/22 157 lb (71.2 kg)  01/21/22 165 lb (74.8 kg)     EXAM: General: Pt appears well and is in NAD  Eyes: External eye exam normal without stare, lid lag or exophthalmos.  EOM intact.   Neck: General: Supple without adenopathy. Thyroid: Thyroid size normal.  No goiter or nodules appreciated.  Lungs: Clear with good BS bilat with no rales, rhonchi, or wheezes  Heart: Auscultation: RRR.  Abdomen: Normoactive bowel sounds, soft, nontender, without masses or organomegaly palpable  Extremities:  BL LE: No pretibial edema normal ROM and strength.  Mental Status: Judgment, insight: Intact Orientation: Oriented to time, place, and person Mood and affect: No depression, anxiety, or agitation     DATA REVIEWED:  Latest Reference Range & Units 12/12/22 10:50  TSH 0.35 - 5.50 uIU/mL 0.12 (L)     Latest Reference Range & Units 08/11/22 13:49 10/15/22 08:15  TSH 0.35 - 5.50 uIU/mL 1.55 4.34     ASSESSMENT/PLAN/RECOMMENDATIONS:   Hypothyroidism :  -Patient did have symptoms of hyperthyroid in [place] but her TSH was 4.34 u IU/mL, the patient endorsed improved symptoms once she switched taking Armour Thyroid from the morning to the nighttime -No local neck symptoms -She used to  be on Synthroid but did not like its effect and  would like to stay on Armour Thyroid -She was having a lot of difficulty obtaining Armour Thyroid 180 mg tablets and 90 mg tablets from the pharmacy, we opted to switch to 180 mg tablet, taking 1.5 tabs 3 times a week and 1 tablet the rest of the week, to see if this will be better -Given her TSH is low today, I will reduce Armour Thyroid to 180 mg tablet daily , but I would not change her actual prescription at this time -Will  recheck in 2 months  Medications : Decrease Armour thyroid 180 mg daily     F/U in 6 months   Labs in 2 months   Signed electronically by: Mack Guise, MD  [facility]  [facility] [address] [place],  [address] Phone: [phone] FAX: [phone]   CC: Harlan Stains, MD [place] [address] Phone: [phone] Fax: [phone]   Return to Endocrinology clinic as below: No future appointments.

## 2022-12-17 ENCOUNTER — Encounter: Payer: Self-pay | Admitting: Internal Medicine

## 2023-01-15 ENCOUNTER — Telehealth: Payer: Self-pay

## 2023-01-15 MED ORDER — LEVOTHYROXINE SODIUM 200 MCG PO TABS: 200.0000 ug | ORAL_TABLET | Freq: Every day | ORAL | 3 refills | Status: DC

## 2023-01-15 NOTE — Telephone Encounter (Signed)
Faxed received from [facility] requesting drug change on the Armour '180mg'$  to the Levothyroxine Sodium as drug is not covered by plan.

## 2023-01-15 NOTE — Telephone Encounter (Signed)
I sent a prescription for levothyroxine 200 mcg daily to replace the Armour Thyroid  She is scheduled for repeat labs on 02/06/2023, please reschedule that to the week of May 6  Since we are making changes to levothyroxine, it is too soon to recheck in the weeks   Thanks

## 2023-01-16 MED ORDER — ARMOUR THYROID 180 MG PO TABS: 180.0000 mg | ORAL_TABLET | Freq: Every day | ORAL | 3 refills | Status: DC

## 2023-01-16 NOTE — Telephone Encounter (Signed)
Medication was changed back to the Armour Thyroid and patient is aware that is not covered by insurance and will pay for it,

## 2023-01-22 ENCOUNTER — Other Ambulatory Visit: Payer: Self-pay

## 2023-01-28 ENCOUNTER — Encounter: Payer: Self-pay | Admitting: Internal Medicine

## 2023-02-04 ENCOUNTER — Other Ambulatory Visit: Payer: Medicare Other

## 2023-02-06 ENCOUNTER — Other Ambulatory Visit: Payer: Medicare Other

## 2023-02-13 ENCOUNTER — Telehealth: Payer: Self-pay | Admitting: *Deleted

## 2023-02-13 ENCOUNTER — Other Ambulatory Visit: Payer: Self-pay | Admitting: *Deleted

## 2023-02-13 MED ORDER — FLUTICASONE-SALMETEROL 250-50 MCG/ACT IN AEPB: 1.0000 | INHALATION_SPRAY | Freq: Two times a day (BID) | RESPIRATORY_TRACT | 0 refills | Status: DC

## 2023-02-13 NOTE — Telephone Encounter (Signed)
Received a fax from pharmacy that Advair Diskus is not covered by insurance, states that preferred alternatives are Breo or Dulera. Would you like to switch to either of these?

## 2023-02-17 ENCOUNTER — Other Ambulatory Visit: Payer: Self-pay | Admitting: *Deleted

## 2023-02-17 MED ORDER — BREO ELLIPTA 100-25 MCG/ACT IN AEPB: 1.0000 | INHALATION_SPRAY | Freq: Every day | RESPIRATORY_TRACT | 5 refills | Status: DC

## 2023-02-17 NOTE — Telephone Encounter (Signed)
Called and spoke with patient and advised of change in medication and new prescription being send in. Patient verbalized understanding and will call back if there are any issues.

## 2023-02-19 ENCOUNTER — Other Ambulatory Visit (INDEPENDENT_AMBULATORY_CARE_PROVIDER_SITE_OTHER): Payer: Medicare Other

## 2023-02-19 DIAGNOSIS — E039 Hypothyroidism, unspecified: Secondary | ICD-10-CM | POA: Diagnosis not present

## 2023-02-19 LAB — TSH: TSH: 0.82 u[IU]/mL (ref 0.35–5.50)

## 2023-03-02 MED ORDER — FLUTICASONE FUROATE-VILANTEROL 200-25 MCG/ACT IN AEPB: 1.0000 | INHALATION_SPRAY | Freq: Every day | RESPIRATORY_TRACT | 5 refills | Status: DC

## 2023-03-02 MED ORDER — FLUTICASONE-SALMETEROL 115-21 MCG/ACT IN AERO: INHALATION_SPRAY | RESPIRATORY_TRACT | 5 refills | Status: DC

## 2023-03-02 NOTE — Telephone Encounter (Signed)
Per the patient's pharmacy the [facility] inhaler will cost $293.61. Please advise on sending in the [facility] inhaler as an alternative.

## 2023-03-02 NOTE — Telephone Encounter (Signed)
I send in the medication and spoke to the pharmacist all inhaler we try to send in will be in the $200 range due to the patient not meeting her deductible. Patient verbalized understanding and will try the Advair 115 inhaler out that was sent into her pharmacy.

## 2023-03-02 NOTE — Telephone Encounter (Signed)
I send in the Breo 200 inhaler and I called the pharmacy. The pharmacist ran the medication and stated that the Breo 200 is $236.   Advair HFA is level 1  Advair Diskcus level 2  Dulera and Breo are T/3 medications. Patient has not tried or failed Advair HFA so this could be the cause for the medications not being covered and the cost issues. Please advise if this inhaler would be ok to send in.

## 2023-03-02 NOTE — Addendum Note (Signed)
Addended by: Orson Aloe on: 03/02/2023 02:24 PM   Modules accepted: Orders

## 2023-03-02 NOTE — Addendum Note (Signed)
Addended by: Orson Aloe on: 03/02/2023 01:30 PM   Modules accepted: Orders

## 2023-04-13 ENCOUNTER — Other Ambulatory Visit: Payer: Self-pay

## 2023-04-13 MED ORDER — LORATADINE 10 MG PO TABS: 10.0000 mg | ORAL_TABLET | Freq: Two times a day (BID) | ORAL | 0 refills | Status: DC | PRN

## 2023-06-15 ENCOUNTER — Ambulatory Visit: Payer: Medicare Other | Admitting: Internal Medicine

## 2023-06-24 ENCOUNTER — Other Ambulatory Visit: Payer: Self-pay | Admitting: Allergy and Immunology

## 2023-07-20 ENCOUNTER — Telehealth: Payer: Self-pay

## 2023-07-24 NOTE — Telephone Encounter (Signed)
DONE

## 2024-01-13 ENCOUNTER — Telehealth: Payer: Self-pay

## 2024-01-13 NOTE — Telephone Encounter (Signed)
Please contact patient to schedule follow up appt.

## 2024-02-11 ENCOUNTER — Ambulatory Visit (INDEPENDENT_AMBULATORY_CARE_PROVIDER_SITE_OTHER): Admitting: Internal Medicine

## 2024-02-11 ENCOUNTER — Encounter: Payer: Self-pay | Admitting: Internal Medicine

## 2024-02-11 VITALS — BP 120/74 | HR 61 | Ht 64.0 in | Wt 151.0 lb

## 2024-02-11 DIAGNOSIS — E039 Hypothyroidism, unspecified: Secondary | ICD-10-CM | POA: Diagnosis not present

## 2024-02-11 LAB — T4, FREE: Free T4: 0.7 ng/dL — ABNORMAL LOW (ref 0.8–1.8)

## 2024-02-11 LAB — TSH: TSH: 0.34 m[IU]/L — ABNORMAL LOW (ref 0.40–4.50)

## 2024-02-11 NOTE — Progress Notes (Unsigned)
 Name: Kelsey Hart  MRN/ DOB: [id number], [birth year]    Age/ Sex: 73 y.o., female    PCP: Laurann Montana, MD   Reason for Endocrinology Evaluation: Hypothyroidism     Date of Initial Endocrinology Evaluation: 08/11/2022    HPI: Kelsey Hart is a 73 y.o. female with a past medical history of RA, anxiety/stress and SVT. The patient presented for initial endocrinology clinic visit on 08/11/2022  for consultative assistance with her Hypothyroidism.   Pt has been diagnosed with hypothyroidism 1993 She  was on Synthroid initially but was not happy with it and was switched to Armour thyroid   NO Fh of thyroid disease    SUBJECTIVE:    Today (02/11/24):  Kelsey Hart is here for a follow up on hypothyroidism.   She continues to take Armour Thyroid at night, this makes her feel better She has been noted with weight loss Denies local neck swelling  Denies constipation or diarrhea   Has SVT , continues with occasional palpitations over the past 4 weeks a well as heat intolerance  Denies Biotin  Has noted anxiety and jittery sensation   Armour thyroid 180 mg daily      HISTORY:  Past Medical History:  Past Medical History:  Diagnosis Date   Allergy    Arthritis    Rheumatoid   Asthma    Broken ankle 1980   Chronic kidney disease    SVT (supraventricular tachycardia) (HCC)    Thyroid disease    Past Surgical History:  Past Surgical History:  Procedure Laterality Date   ANKLE SURGERY  1980   FRACTURE SURGERY      Social History:  reports that she has never smoked. She has never used smokeless tobacco. She reports that she does not drink alcohol and does not use drugs. Family History: family history includes Dementia in her maternal grandmother and mother; Diabetes in her maternal grandmother and maternal uncle; Heart disease in her father and paternal grandmother; Stroke in her maternal grandfather and paternal grandfather.   HOME MEDICATIONS: Allergies as of  02/11/2024       Reactions   Cat Dander    Levonorgestrel-ethinyl Estrad Other (See Comments)        Medication List        Accurate as of February 11, 2024  9:00 AM. If you have any questions, ask your nurse or doctor.          albuterol 108 (90 Base) MCG/ACT inhaler Commonly known as: ProAir HFA Inhale 2 puffs into the lungs every 4 (four) hours as needed for wheezing or shortness of breath. Inhale two puffs every four to six hours as needed for cough or wheeze.   Armour Thyroid 180 MG tablet Generic drug: thyroid Take 1 tablet (180 mg total) by mouth daily.   Breo Ellipta 100-25 MCG/ACT Aepb Generic drug: fluticasone furoate-vilanterol Inhale 1 puff into the lungs daily.   CRANBERRY PO Take 8,400 mg by mouth daily.   fluticasone 50 MCG/ACT nasal spray Commonly known as: FLONASE Place 2 sprays into both nostrils daily.   fluticasone-salmeterol 115-21 MCG/ACT inhaler Commonly known as: Advair HFA Take 2 inhalations 1-2 times per day   folic acid 800 MCG tablet Commonly known as: FOLVITE Take 800 mcg by mouth daily.   HUMIRA PEN [place] Inject 40 mg into the skin. Twice monthly.   loratadine 10 MG tablet Commonly known as: CLARITIN Take 1 tablet (10 mg total) by mouth 2 (two) times  daily as needed for allergies (Can take an extra dose during flare ups.).   metoprolol tartrate 25 MG tablet Commonly known as: LOPRESSOR Take 25 mg by mouth 2 (two) times daily as needed. As needed   multivitamin tablet Take 1 tablet by mouth daily.   omeprazole 40 MG capsule Commonly known as: PRILOSEC Take 1 capsule (40 mg total) by mouth 2 (two) times daily as needed. TAKE 1 CAPSULE BY MOUTH 1 TO 2 TIMES DAILY   PROBIOTIC DAILY PO Take by mouth daily.   SAM-e 400 MG Tabs Take by mouth daily.   TURMERIC PO Take by mouth.   VITAMIN D PO Take 1,000 Units by mouth daily.          REVIEW OF SYSTEMS: A comprehensive ROS was conducted with the patient and is negative  except as per HPI    OBJECTIVE:  VS: BP 120/74 (BP Location: Left Arm, Patient Position: Sitting, Cuff Size: Normal)   Pulse 61   Ht 5\' 4"  (1.626 m)   Wt 151 lb (68.5 kg)   SpO2 99%   BMI 25.92 kg/m    Wt Readings from Last 3 Encounters:  02/11/24 151 lb (68.5 kg)  12/12/22 157 lb (71.2 kg)  08/11/22 157 lb (71.2 kg)     EXAM: General: Pt appears well and is in NAD  Neck: General: Supple without adenopathy. Thyroid: Thyroid size normal.  No goiter or nodules appreciated.  Lungs: Clear with good BS bilat   Heart: Auscultation: RRR.  Abdomen: soft, nontender  Extremities:  BL LE: Trace pretibial edema   Mental Status: Judgment, insight: Intact Orientation: Oriented to time, place, and person Mood and affect: No depression, anxiety, or agitation     DATA REVIEWED:  ***  ASSESSMENT/PLAN/RECOMMENDATIONS:   Hypothyroidism :  -She used to be on Synthroid but did not like its effect and  would like to stay on Armour Thyroid  Medications :  Armour thyroid 180 mg daily     F/U in 6 months    Signed electronically by: Lyndle Herrlich, MD  [facility]  [facility] [address] [place] [place], [place] [address] Phone: [phone] FAX: [phone]   CC: Laurann Montana, MD [phone] [place] [address] Phone: [phone] Fax: [phone]   Return to Endocrinology clinic as below: Future Appointments  Date Time Provider Department Center  02/11/2024  9:10 AM Pamalee Marcoe, Konrad Dolores, MD [id number] None

## 2024-02-12 ENCOUNTER — Encounter: Payer: Self-pay | Admitting: Internal Medicine

## 2024-02-12 MED ORDER — THYROID 120 MG PO TABS: 120.0000 mg | ORAL_TABLET | Freq: Every day | ORAL | 6 refills | Status: DC

## 2024-03-25 ENCOUNTER — Telehealth: Payer: Self-pay | Admitting: Internal Medicine

## 2024-03-25 ENCOUNTER — Other Ambulatory Visit: Payer: Self-pay

## 2024-03-25 ENCOUNTER — Other Ambulatory Visit

## 2024-03-25 DIAGNOSIS — E039 Hypothyroidism, unspecified: Secondary | ICD-10-CM

## 2024-03-25 NOTE — Progress Notes (Signed)
 MD on call stated to order TSH, free T3 and free T4 for patient to get done today. Patient should follow up Monday with her usual MD. Patient called and made aware and transferred to the front desk to schedule appointments.

## 2024-03-25 NOTE — Telephone Encounter (Signed)
 Patient call returned to discuss symptoms. Per patient she has felt "over-heated" and "irritable" that has been going on for the past 10-14 days. Patient has not had any other changes to her regimen or lifestyle.

## 2024-03-25 NOTE — Telephone Encounter (Signed)
 Patient is calling to say that she is still not feeling well and is wanting to know if her thyroid  thyroid  (ARMOUR THYROID ) 120 MG tablet Dosage needs to be adjusted.

## 2024-03-25 NOTE — Telephone Encounter (Signed)
 LMx1 to schedule appointment for Monday, 03/28/2024 with Dr. Vertell Gory.

## 2024-03-26 LAB — TSH: TSH: 4.04 m[IU]/L (ref 0.40–4.50)

## 2024-03-26 LAB — T3, FREE: T3, Free: 3.9 pg/mL (ref 2.3–4.2)

## 2024-03-26 LAB — T4, FREE: Free T4: 0.5 ng/dL — ABNORMAL LOW (ref 0.8–1.8)

## 2024-03-28 ENCOUNTER — Encounter: Payer: Self-pay | Admitting: "Endocrinology

## 2024-03-28 ENCOUNTER — Ambulatory Visit (INDEPENDENT_AMBULATORY_CARE_PROVIDER_SITE_OTHER): Admitting: "Endocrinology

## 2024-03-28 VITALS — BP 136/82 | HR 66 | Ht 64.0 in | Wt 153.0 lb

## 2024-03-28 DIAGNOSIS — E039 Hypothyroidism, unspecified: Secondary | ICD-10-CM | POA: Diagnosis not present

## 2024-03-28 NOTE — Progress Notes (Signed)
 Outpatient Endocrinology Note Jorge Newcomer, MD  03/28/24   Kelsey Hart 12-02-1950 [id number]  Referring Provider: Victorio Grave, MD Primary Care Provider: Victorio Grave, MD Subjective  No chief complaint on file.   Assessment & Plan  Diagnoses and all orders for this visit:  Acquired hypothyroidism -     TSH -     T3, free -     T4, free   BANESSA MAO is currently taking armour thyroid  120mg . Previously on armour 180mg  the TSH was 0.3. She takes Armour Thyroid  at night, 5 hrs after food. Past medical history of RA, anxiety/stress and SVT. She has been diagnosed with hypothyroidism in 1993. She was on Synthroid  initially but was not happy with it (emotional health) and was switched to Armour thyroid .  Patient is currently biochemically euthyroid with low FT4.  Educated on thyroid  axis.  03/28/24  Recommend the following: Take armour thyroid  150 mg every morning (1+1/4 grain). Patient is not interested in LT4+LT3 combination or synthroid /LT4 by itself.  Advised to take it on empty stomach and wait at least 30 minutes to 1 hour before eating or drinking anything or taking any other medications. Space out levothyroxine  by 4 hours from any acid reflux medication/fibrate/iron/calcium/multivitamin. Advised to take nutritional supplements in the evening. Repeat lab before next visit or sooner if symptoms of hyperthyroidism or hypothyroidism develop.  Notify us  immediately in case of significant weight gain or loss. Counseled on compliance and follow up needs.  I have reviewed current medications, nurse's notes, allergies, vital signs, past medical and surgical history, family medical history, and social history for this encounter. Counseled patient on symptoms, examination findings, lab findings, imaging results, treatment decisions and monitoring and prognosis. The patient understood the recommendations and agrees with the treatment plan. All questions regarding treatment  plan were fully answered.   Return in about 6 weeks (around 05/09/2024) for visit + labs before next visit.   Jorge Newcomer, MD  03/28/24   I have reviewed current medications, nurse's notes, allergies, vital signs, past medical and surgical history, family medical history, and social history for this encounter. Counseled patient on symptoms, examination findings, lab findings, imaging results, treatment decisions and monitoring and prognosis. The patient understood the recommendations and agrees with the treatment plan. All questions regarding treatment plan were fully answered.   History of Present Illness Kelsey Hart is a 73 y.o. year old female who presents for follow up for hypothyroidism diagnosed in 1993.  Seen by Dr. Rosalea Collin in the past but saw me because she needed her hormones checked while the doctor was on holiday, given her symptoms of irritability and heat intolerance.  She takes Armour Thyroid  at night, 5 hrs after food. Past medical history of RA, anxiety/stress and SVT. She has been diagnosed with hypothyroidism in 1993. She was on Synthroid  initially but was not happy with it (emotional health) and was switched to Armour thyroid .  Symptoms suggestive of HYPOTHYROIDISM:  fatigue No weight gain No cold intolerance  No constipation  No  Symptoms suggestive of HYPERTHYROIDISM:  weight loss  Yes heat intolerance Yes hyperdefecation  No palpitations  No, has hx fo SVTs  Compressive symptoms:  dysphagia  No dysphonia  No positional dyspnea (especially with simultaneous arms elevation)  No   Physical Exam  BP 136/82   Pulse 66   Ht 5\' 4"  (1.626 m)   Wt 153 lb (69.4 kg)   SpO2 98%   BMI 26.26 kg/m  Constitutional:  well developed, well nourished Head: normocephalic, atraumatic, no exophthalmos Eyes: sclera anicteric, no redness Neck: no thyromegaly, no thyroid  tenderness; no nodules palpated Lungs: normal respiratory effort Neurology: alert and oriented,  no fine hand tremor Skin: dry, no appreciable rashes Musculoskeletal: no appreciable defects Psychiatric: normal mood and affect  Allergies Allergies  Allergen Reactions   Cat Dander    Levonorgestrel-Ethinyl Estrad Other (See Comments)    Current Medications Patient's Medications  New Prescriptions   No medications on file  Previous Medications   ADALIMUMAB (HUMIRA PEN [place])    Inject 40 mg into the skin. Twice monthly.   ALBUTEROL  (PROAIR  HFA) 108 (90 BASE) MCG/ACT INHALER    Inhale 2 puffs into the lungs every 4 (four) hours as needed for wheezing or shortness of breath. Inhale two puffs every four to six hours as needed for cough or wheeze.   BREO ELLIPTA  100-25 MCG/ACT AEPB    Inhale 1 puff into the lungs daily.   CHOLECALCIFEROL (VITAMIN D PO)    Take 1,000 Units by mouth daily.   CRANBERRY PO    Take 8,400 mg by mouth daily.   FLUTICASONE  (FLONASE ) 50 MCG/ACT NASAL SPRAY    Place 2 sprays into both nostrils daily.   FLUTICASONE -SALMETEROL (ADVAIR HFA) 115-21 MCG/ACT INHALER    Take 2 inhalations 1-2 times per day   FOLIC ACID (FOLVITE) 800 MCG TABLET    Take 800 mcg by mouth daily.   LORATADINE  (CLARITIN ) 10 MG TABLET    Take 1 tablet (10 mg total) by mouth 2 (two) times daily as needed for allergies (Can take an extra dose during flare ups.).   METOPROLOL  TARTRATE (LOPRESSOR ) 25 MG TABLET    Take 25 mg by mouth 2 (two) times daily as needed. As needed   MULTIPLE VITAMIN (MULTIVITAMIN) TABLET    Take 1 tablet by mouth daily.   OMEPRAZOLE  (PRILOSEC) 40 MG CAPSULE    Take 1 capsule (40 mg total) by mouth 2 (two) times daily as needed. TAKE 1 CAPSULE BY MOUTH 1 TO 2 TIMES DAILY   PROBIOTIC PRODUCT (PROBIOTIC DAILY PO)    Take by mouth daily.   S-ADENOSYLMETHIONINE (SAM-E) 400 MG TABS    Take by mouth daily.   THYROID  (ARMOUR THYROID ) 120 MG TABLET    Take 1 tablet (120 mg total) by mouth daily before breakfast.   TURMERIC PO    Take by mouth.  Modified Medications   No medications  on file  Discontinued Medications   No medications on file    Past Medical History Past Medical History:  Diagnosis Date   Allergy    Arthritis    Rheumatoid   Asthma    Broken ankle 1980   Chronic kidney disease    SVT (supraventricular tachycardia) (HCC)    Thyroid  disease     Past Surgical History Past Surgical History:  Procedure Laterality Date   ANKLE SURGERY  1980   FRACTURE SURGERY      Family History family history includes Dementia in her maternal grandmother and mother; Diabetes in her maternal grandmother and maternal uncle; Heart disease in her father and paternal grandmother; Stroke in her maternal grandfather and paternal grandfather.  Social History Social History   Socioeconomic History   Marital status: Married    Spouse name: Rebbeca Campi   Number of children: 0   Years of education: college   Highest education level: Not on file  Occupational History   Occupation: ADMINISTRATIVE ASSISTANT    Employer: [employer]  INVESTMENT PROPERTIES  Tobacco Use   Smoking status: Never   Smokeless tobacco: Never  Vaping Use   Vaping status: Never Used  Substance and Sexual Activity   Alcohol use: No   Drug use: No   Sexual activity: Never    Partners: Male    Birth control/protection: None  Other Topics Concern   Not on file  Social History Narrative   Lives with her husband.   Adopted son lives with them, due to unemployment. "He is a "Nietzche-ist" and doesn't believe that he needs to work." "he is a pot-head." History of violence, but not since adolescence.   Husband has chronic pain, seeing a specialist. Lot's of pharmaceuticals. Not violent, but they have nearly no relationship.   Social Drivers of Corporate investment banker Strain: Not on file  Food Insecurity: Not on file  Transportation Needs: Not on file  Physical Activity: Not on file  Stress: Not on file  Social Connections: Unknown (09/02/2023)   [facility]   Social Network     Social Network: Not on file  Intimate Partner Violence: Unknown (09/02/2023)   Received from [facility]   HITS    Physically Hurt: Not on file    Insult or Talk Down To: Not on file    Threaten Physical Harm: Not on file    Scream or Curse: Not on file    Laboratory Investigations Lab Results  Component Value Date   TSH 4.04 03/25/2024   TSH 0.34 (L) 02/11/2024   TSH 0.82 02/19/2023   FREET4 0.5 (L) 03/25/2024   FREET4 0.7 (L) 02/11/2024     No results found for: "TSI"   No components found for: "TRAB"   Lab Results  Component Value Date   CHOL 172 11/09/2017   Lab Results  Component Value Date   HDL 56 11/09/2017   Lab Results  Component Value Date   LDLCALC 97 11/09/2017   Lab Results  Component Value Date   TRIG 97 11/09/2017   Lab Results  Component Value Date   CHOLHDL 3.1 11/09/2017   Lab Results  Component Value Date   CREATININE 0.75 11/09/2017   No results found for: "GFR"    Component Value Date/Time   NA 141 11/09/2017 0917   K 4.4 11/09/2017 0917   CL 103 11/09/2017 0917   CO2 23 11/09/2017 0917   GLUCOSE 93 11/09/2017 0917   GLUCOSE 152 (H) 08/15/2012 0920   BUN 11 11/09/2017 0917   CREATININE 0.75 11/09/2017 0917   CREATININE 0.62 07/10/2012 1005   CALCIUM 9.9 11/09/2017 0917   PROT 7.2 11/09/2017 0917   ALBUMIN 4.5 11/09/2017 0917   AST 20 11/09/2017 0917   ALT 27 11/09/2017 0917   ALKPHOS 55 11/09/2017 0917   BILITOT 0.5 11/09/2017 0917   GFRNONAA 84 11/09/2017 0917   GFRAA 97 11/09/2017 0917      Latest Ref Rng & Units 11/09/2017    9:17 AM 08/15/2012    9:20 AM 07/10/2012   10:05 AM  BMP  Glucose 65 - 99 mg/dL 93  098  96   BUN 8 - 27 mg/dL 11  20  15    Creatinine 0.57 - 1.00 mg/dL 1.19  1.47  8.29   BUN/Creat Ratio 12 - 28 15     Sodium 134 - 144 mmol/L 141  137  137   Potassium 3.5 - 5.2 mmol/L 4.4  4.2  4.3   Chloride 96 - 106 mmol/L  103  103  103   CO2 20 - 29 mmol/L 23  21  27    Calcium 8.7 - 10.3 mg/dL 9.9  9.8   9.3        Component Value Date/Time   WBC 4.6 11/09/2017 0917   WBC 11.8 (H) 08/15/2012 0920   RBC 4.50 11/09/2017 0917   RBC 4.45 08/15/2012 0920   HGB 13.5 11/09/2017 0917   HCT 40.6 11/09/2017 0917   PLT 280 11/09/2017 0917   MCV 90 11/09/2017 0917   MCH 30.0 11/09/2017 0917   MCH 27.0 08/15/2012 0920   MCHC 33.3 11/09/2017 0917   MCHC 32.5 08/15/2012 0920   RDW 13.4 11/09/2017 0917   LYMPHSABS 2.0 11/09/2017 0917   MONOABS 0.8 08/15/2012 0920   EOSABS 0.2 11/09/2017 0917   BASOSABS 0.0 11/09/2017 0917      Parts of this note may have been dictated using voice recognition software. There may be variances in spelling and vocabulary which are unintentional. Not all errors are proofread. Please notify the [facility] if any discrepancies are noted or if the meaning of any statement is not clear.

## 2024-03-28 NOTE — Patient Instructions (Signed)
 Recommend the following: Take armour thyroid  150 mg every morning (1+1/4 grain). Patient is not interested in LT4+LT3 combination separately.  Advised to take it on empty stomach and wait at least 30 minutes to 1 hour before eating or drinking anything or taking any other medications. Space out levothyroxine  by 4 hours from any acid reflux medication/fibrate/iron/calcium/multivitamin. Advised to take nutritional supplements in the evening. Repeat lab before next visit or sooner if symptoms of hyperthyroidism or hypothyroidism develop.

## 2024-03-31 ENCOUNTER — Encounter: Payer: Self-pay | Admitting: Internal Medicine

## 2024-04-12 ENCOUNTER — Other Ambulatory Visit

## 2024-04-12 LAB — TSH: TSH: 0.48 m[IU]/L (ref 0.40–4.50)

## 2024-04-12 LAB — T4, FREE: Free T4: 0.9 ng/dL (ref 0.8–1.8)

## 2024-04-12 LAB — T3, FREE: T3, Free: 5.7 pg/mL — ABNORMAL HIGH (ref 2.3–4.2)

## 2024-04-15 ENCOUNTER — Encounter: Payer: Self-pay | Admitting: Internal Medicine

## 2024-04-15 ENCOUNTER — Ambulatory Visit (INDEPENDENT_AMBULATORY_CARE_PROVIDER_SITE_OTHER): Admitting: Internal Medicine

## 2024-04-15 VITALS — BP 142/70 | HR 87 | Resp 20 | Ht 64.0 in | Wt 151.0 lb

## 2024-04-15 DIAGNOSIS — E039 Hypothyroidism, unspecified: Secondary | ICD-10-CM

## 2024-04-15 MED ORDER — THYROID 120 MG PO TABS: 120.0000 mg | ORAL_TABLET | Freq: Every day | ORAL | 3 refills | Status: AC

## 2024-04-15 MED ORDER — THYROID 30 MG PO TABS: 30.0000 mg | ORAL_TABLET | Freq: Every day | ORAL | 6 refills | Status: DC

## 2024-04-15 NOTE — Patient Instructions (Signed)
 Take Armour Thyroid  120 mg daily PLUS Armour Thyroid  30 mg daily

## 2024-04-15 NOTE — Progress Notes (Signed)
 Name: Kelsey Hart  MRN/ DOB: [id number], December 30, 1950    Age/ Sex: 73 y.o., female    PCP: Victorio Grave, MD   Reason for Endocrinology Evaluation: Hypothyroidism     Date of Initial Endocrinology Evaluation: 08/11/2022    HPI: Kelsey Hart is a 73 y.o. female with a past medical history of RA, anxiety/stress and SVT. The patient presented for initial endocrinology clinic visit on 08/11/2022  for consultative assistance with her Hypothyroidism.   Pt has been diagnosed with hypothyroidism 1993 She  was on Synthroid  initially but was not happy with it and was switched to Armour thyroid    NO Fh of thyroid  disease    SUBJECTIVE:    Today (04/15/24):  Kelsey Hart is here for a follow up on hypothyroidism.   She continues to take Armour Thyroid  at night, this makes her feel better  She was recently seen by my partner Dr. Vertell Gory for a low free T4 and Armour Thyroid  was increased 150 Mg   Weight continues to fluctuate She is feeling better on this dose of armour thyroid   Denies local neck swelling  Denies constipation or diarrhea   Has SVT , has not felt palpitations over the past month Denies Biotin  Anxiety and jittery sensation  has subsided just more emotional due to external stress  She has noted eyestrain and some eye irritation, she does work on the computer during the day  Armour thyroid  120 half a tablet daily   Armour thyroid  180 half a tablet daily      HISTORY:  Past Medical History:  Past Medical History:  Diagnosis Date   Allergy    Arthritis    Rheumatoid   Asthma    Broken ankle 1980   Chronic kidney disease    SVT (supraventricular tachycardia) (HCC)    Thyroid  disease    Past Surgical History:  Past Surgical History:  Procedure Laterality Date   ANKLE SURGERY  1980   FRACTURE SURGERY      Social History:  reports that she has never smoked. She has never used smokeless tobacco. She reports that she does not drink alcohol and does not  use drugs. Family History: family history includes Dementia in her maternal grandmother and mother; Diabetes in her maternal grandmother and maternal uncle; Heart disease in her father and paternal grandmother; Stroke in her maternal grandfather and paternal grandfather.   HOME MEDICATIONS: Allergies as of 04/15/2024       Reactions   Cat Dander    Levonorgestrel-ethinyl Estrad Other (See Comments)        Medication List        Accurate as of April 15, 2024  8:46 AM. If you have any questions, ask your nurse or doctor.          albuterol  108 (90 Base) MCG/ACT inhaler Commonly known as: ProAir  HFA Inhale 2 puffs into the lungs every 4 (four) hours as needed for wheezing or shortness of breath. Inhale two puffs every four to six hours as needed for cough or wheeze.   Breo Ellipta  100-25 MCG/ACT Aepb Generic drug: fluticasone  furoate-vilanterol Inhale 1 puff into the lungs daily.   CRANBERRY PO Take 8,400 mg by mouth daily.   fluticasone  50 MCG/ACT nasal spray Commonly known as: FLONASE  Place 2 sprays into both nostrils daily.   fluticasone -salmeterol 115-21 MCG/ACT inhaler Commonly known as: Advair HFA Take 2 inhalations 1-2 times per day   folic acid 800 MCG tablet Commonly known  as: FOLVITE Take 800 mcg by mouth daily.   HUMIRA PEN [place] Inject 40 mg into the skin. Twice monthly.   loratadine  10 MG tablet Commonly known as: CLARITIN  Take 1 tablet (10 mg total) by mouth 2 (two) times daily as needed for allergies (Can take an extra dose during flare ups.).   metoprolol  tartrate 25 MG tablet Commonly known as: LOPRESSOR  Take 25 mg by mouth 2 (two) times daily as needed. As needed   multivitamin tablet Take 1 tablet by mouth daily.   omeprazole  40 MG capsule Commonly known as: PRILOSEC Take 1 capsule (40 mg total) by mouth 2 (two) times daily as needed. TAKE 1 CAPSULE BY MOUTH 1 TO 2 TIMES DAILY   PROBIOTIC DAILY PO Take by mouth daily.   SAM-e 400 MG  Tabs Take by mouth daily.   thyroid  120 MG tablet Commonly known as: Armour Thyroid  Take 1 tablet (120 mg total) by mouth daily before breakfast. What changed: how much to take   TURMERIC PO Take by mouth.   VITAMIN D PO Take 1,000 Units by mouth daily.          REVIEW OF SYSTEMS: A comprehensive ROS was conducted with the patient and is negative except as per HPI    OBJECTIVE:  VS: BP (!) 142/70   Pulse 87   Resp 20   Ht 5\' 4"  (1.626 m)   Wt 151 lb (68.5 kg)   SpO2 98%   BMI 25.92 kg/m    Wt Readings from Last 3 Encounters:  04/15/24 151 lb (68.5 kg)  03/28/24 153 lb (69.4 kg)  02/11/24 151 lb (68.5 kg)     EXAM: General: Pt appears well and is in NAD  Neck: General: Supple without adenopathy. Thyroid : Thyroid  size normal.  No goiter or nodules appreciated.  Lungs: Clear with good BS bilat   Heart: Auscultation: RRR.  Abdomen: soft, nontender  Extremities:  BL LE: Trace pretibial edema   Mental Status: Judgment, insight: Intact Orientation: Oriented to time, place, and person Mood and affect: No depression, anxiety, or agitation     DATA REVIEWED:   Latest Reference Range & Units 03/25/24 11:29 04/12/24 08:30  TSH 0.40 - 4.50 mIU/L 4.04 0.48  Triiodothyronine,Free,Serum 2.3 - 4.2 pg/mL 3.9 5.7 (H)  T4,Free(Direct) 0.8 - 1.8 ng/dL 0.5 (L) 0.9       ASSESSMENT/PLAN/RECOMMENDATIONS:   Hypothyroidism :  -She used to be on Synthroid  but did not like its effect and  would like to stay on Armour Thyroid  -She was seen by my partner last month and due to a low free T4 her Armour Thyroid  dose has been increased from 120to 150 Mg daily -Free T3 was elevated, but the patient is asymptomatic, I did discuss my concern with TSH decreasing from 4 to 0.48 u IU/mL - We have opted to recheck in 6 weeks - Armour Thyroid  is not available and 150 Mg dose, she is having to take half of a 180 Mg dose and half of 120 Mg dose, will make the following  adjustments   Medications :  Take Armour Thyroid  120 Mg daily Take Armour Thyroid  30 mg daily   F/U in 6 months    Signed electronically by: Natale Bail, MD  [facility] Endocrinology  [facility] [address] [place], [place] [address] Phone: [phone] FAX: [phone]   CC: Victorio Grave, MD [phone] [address] [place] [address] Phone: [phone] Fax: [phone]  Return to Endocrinology clinic as below: No future appointments.

## 2024-04-16 ENCOUNTER — Other Ambulatory Visit: Payer: Self-pay | Admitting: Allergy and Immunology

## 2024-04-20 ENCOUNTER — Other Ambulatory Visit: Payer: Self-pay | Admitting: Allergy and Immunology

## 2024-04-22 ENCOUNTER — Telehealth: Payer: Self-pay | Admitting: Allergy and Immunology

## 2024-04-22 NOTE — Telephone Encounter (Signed)
 Pattye called and asked if she could get courtesy refills of Brio and Claritin  until she can be seen at her 7/1 OV. I informed her I would ask, but she has not been seen since 2023, so they probably will want to see you first. Kathern Panda on [address]. Contact [phone].

## 2024-04-25 MED ORDER — BREO ELLIPTA 100-25 MCG/ACT IN AEPB
1.0000 | INHALATION_SPRAY | Freq: Every day | RESPIRATORY_TRACT | 0 refills | Status: DC
Start: 2024-04-25 — End: 2024-05-10

## 2024-04-25 NOTE — Telephone Encounter (Signed)
 Called and informed patient of the courtesy refill. Patient verbalized understanding and agreed to keep her upcoming appointment.

## 2024-05-10 ENCOUNTER — Ambulatory Visit (INDEPENDENT_AMBULATORY_CARE_PROVIDER_SITE_OTHER): Admitting: Allergy and Immunology

## 2024-05-10 ENCOUNTER — Other Ambulatory Visit: Payer: Self-pay

## 2024-05-10 VITALS — BP 128/64 | HR 61 | Temp 97.6°F | Resp 14 | Ht 64.37 in | Wt 149.3 lb

## 2024-05-10 DIAGNOSIS — K219 Gastro-esophageal reflux disease without esophagitis: Secondary | ICD-10-CM

## 2024-05-10 DIAGNOSIS — J4540 Moderate persistent asthma, uncomplicated: Secondary | ICD-10-CM

## 2024-05-10 DIAGNOSIS — L2089 Other atopic dermatitis: Secondary | ICD-10-CM | POA: Diagnosis not present

## 2024-05-10 MED ORDER — BREO ELLIPTA 100-25 MCG/ACT IN AEPB
1.0000 | INHALATION_SPRAY | Freq: Every day | RESPIRATORY_TRACT | 3 refills | Status: DC
Start: 2024-05-10 — End: 2024-07-28

## 2024-05-10 MED ORDER — LORATADINE 10 MG PO TABS: 10.0000 mg | ORAL_TABLET | Freq: Every day | ORAL | 3 refills | Status: AC | PRN

## 2024-05-10 MED ORDER — OMEPRAZOLE 40 MG PO CPDR: 40.0000 mg | DELAYED_RELEASE_CAPSULE | Freq: Two times a day (BID) | ORAL | 3 refills | Status: AC | PRN

## 2024-05-10 MED ORDER — ALBUTEROL SULFATE HFA 108 (90 BASE) MCG/ACT IN AERS: 2.0000 | INHALATION_SPRAY | RESPIRATORY_TRACT | 1 refills | Status: AC | PRN

## 2024-05-10 MED ORDER — FLUTICASONE PROPIONATE 50 MCG/ACT NA SUSP
2.0000 | Freq: Every day | NASAL | 3 refills | Status: AC | PRN
Start: 2024-05-10 — End: ?

## 2024-05-10 NOTE — Progress Notes (Unsigned)
 [place] [facility] [place] - [place] - [place] - [place]   Follow-up Note  Referring Provider: Teresa Channel, MD Primary Provider: Teresa Channel, MD Date of Office Visit: 05/10/2024  Subjective:   Kelsey Hart (DOB: September 08, 1951) is a 73 y.o. female who [facility] in re-evaluation of the following:  HPI: Catlyn returns to this clinic in reevaluation of asthma, allergic rhinoconjunctivitis, history of nasal polyposis, history of LPR, in the context of immunosuppression for rheumatoid arthritis.  I last saw her in this clinic 21 January 2022.  She has really done well with control of her asthma and has not required a systemic steroid to treat an exacerbation and rarely if ever uses the short acting bronchodilator while she is currently using her Breo about 3 times per week.  Ranell is quite expensive and she is not sure about a formulary alternative for Breo.  Her nose is doing very well and she has not required an antibiotic to treat an episode of sinusitis while using Flonase  j approximately 1 time per week.  Her reflux is under very good control while using omeprazole  mostly just 1 time per day.  She continues on anti-TNF monoclonal antibody for her rheumatoid arthritis.  Allergies as of 05/10/2024       Reactions   Cat Dander Itching, Cough   Levonorgestrel-ethinyl Estrad Rash        Medication List    albuterol  108 (90 Base) MCG/ACT inhaler Commonly known as: ProAir  HFA Inhale 2 puffs into the lungs every 4 (four) hours as needed for wheezing or shortness of breath. Inhale two puffs every four to six hours as needed for cough or wheeze.   Breo Ellipta  100-25 MCG/ACT Aepb Generic drug: fluticasone  furoate-vilanterol Inhale 1 puff into the lungs daily.   CRANBERRY PO Take 8,400 mg by mouth daily.   fluticasone  50 MCG/ACT nasal spray Commonly known as: FLONASE  Place 2 sprays into both nostrils daily.   folic acid 800 MCG  tablet Commonly known as: FOLVITE Take 800 mcg by mouth daily.   HUMIRA PEN [place] Inject 40 mg into the skin. Twice monthly.   loratadine  10 MG tablet Commonly known as: CLARITIN  Take 1 tablet (10 mg total) by mouth 2 (two) times daily as needed for allergies (Can take an extra dose during flare ups.).   metoprolol  tartrate 25 MG tablet Commonly known as: LOPRESSOR  Take 25 mg by mouth 2 (two) times daily as needed. As needed   multivitamin tablet Take 1 tablet by mouth daily.   omeprazole  40 MG capsule Commonly known as: PRILOSEC Take 1 capsule (40 mg total) by mouth 2 (two) times daily as needed. TAKE 1 CAPSULE BY MOUTH 1 TO 2 TIMES DAILY   PROBIOTIC DAILY PO Take by mouth daily.   SAM-e 400 MG Tabs Take by mouth daily.   thyroid  120 MG tablet Commonly known as: Armour Thyroid  Take 1 tablet (120 mg total) by mouth daily before breakfast. To be taken in conjunction with Armour Thyroid  30 mg   thyroid  30 MG tablet Commonly known as: Armour Thyroid  Take 1 tablet (30 mg total) by mouth daily before breakfast. To be taken in conjunction with Armour Thyroid  120 mg   TURMERIC PO Take by mouth.   VITAMIN D PO Take 1,000 Units by mouth daily.    Past Medical History:  Diagnosis Date   Allergy    Arthritis    Rheumatoid   Asthma    Broken  ankle 1980   Chronic kidney disease    SVT (supraventricular tachycardia) (HCC)    Thyroid  disease     Past Surgical History:  Procedure Laterality Date   ANKLE SURGERY  1980   FRACTURE SURGERY      Review of systems negative except as noted in HPI / PMHx or noted below:  Review of Systems  Constitutional: Negative.   HENT: Negative.    Eyes: Negative.   Respiratory: Negative.    Cardiovascular: Negative.   Gastrointestinal: Negative.   Genitourinary: Negative.   Musculoskeletal: Negative.   Skin: Negative.   Neurological: Negative.   Endo/Heme/Allergies: Negative.   Psychiatric/Behavioral: Negative.        Objective:   Vitals:   05/10/24 0829  BP: 128/64  Pulse: 61  Resp: 14  Temp: 97.6 F (36.4 C)  SpO2: 96%   Height: 5' 4.37 (163.5 cm)  Weight: 149 lb 4.8 oz (67.7 kg)   Physical Exam Constitutional:      Appearance: She is not diaphoretic.  HENT:     Head: Normocephalic.     Right Ear: Tympanic membrane, ear canal and external ear normal.     Left Ear: Tympanic membrane, ear canal and external ear normal.     Nose: Nose normal. No mucosal edema or rhinorrhea.     Mouth/Throat:     Pharynx: Uvula midline. No oropharyngeal exudate.   Eyes:     Conjunctiva/sclera: Conjunctivae normal.   Neck:     Thyroid : No thyromegaly.     Trachea: Trachea normal. No tracheal tenderness or tracheal deviation.   Cardiovascular:     Rate and Rhythm: Normal rate and regular rhythm.     Heart sounds: Normal heart sounds, S1 normal and S2 normal. No murmur heard. Pulmonary:     Effort: No respiratory distress.     Breath sounds: Normal breath sounds. No stridor. No wheezing or rales.  Lymphadenopathy:     Head:     Right side of head: No tonsillar adenopathy.     Left side of head: No tonsillar adenopathy.     Cervical: No cervical adenopathy.   Skin:    Findings: No erythema or rash.     Nails: There is no clubbing.   Neurological:     Mental Status: She is alert.     Diagnostics: Spirometry was performed and demonstrated an FEV1 of 2.45 at 113 % of predicted.  Assessment and Plan:   1. Asthma, moderate persistent, well-controlled   2. Other atopic dermatitis   3. Gastroesophageal reflux disease, unspecified whether esophagitis present    1.  Breo 200 -1 inhalation 3-7 times per week depending on disease activity   2.  Flonase  -1-2 sprays each nostril 3-7 times per week depending on disease activity  3.  Omeprazole  40 mg -1 tablet 1-2 times per day depending on disease activity  4.  If needed:  A.  Loratadine  10 mg -1 tablet 1 time per day B.  Albuterol  HFA -1  inhalation 1 time per day  5. Contact clinic with formulary alternatives for Breo  6.  Return to clinic in 1 year or earlier if problem  7. Influenza = Tamiflu. Covid = Paxlovid  Seila is doing quite well with relatively low doses of anti-inflammatory agents for her airway and attending to her reflux disease with a proton pump inhibitor.  She will continue on this plan although we will see if there is a cheaper formulary alternative for her Ranell and if  she does well with this plan I will see her back in this clinic in 1 year or earlier if there is a problem.  Camellia Denis, MD Allergy / Immunology [facility]

## 2024-05-10 NOTE — Patient Instructions (Addendum)
  1.  Breo 200 -1 inhalation 3-7 times per week depending on disease activity   2.  Flonase  -1-2 sprays each nostril 3-7 times per week depending on disease activity  3.  Omeprazole  40 mg -1 tablet 1-2 times per day depending on disease activity  4.  If needed:  A.  Loratadine  10 mg -1 tablet 1 time per day B.  Albuterol  HFA -1 inhalation 1 time per day  5. Contact clinic with formulary alternatives for Breo  6.  Return to clinic in 1 year or earlier if problem  7. Influenza = Tamiflu. Covid = Paxlovid

## 2024-05-11 ENCOUNTER — Encounter: Payer: Self-pay | Admitting: Allergy and Immunology

## 2024-05-16 ENCOUNTER — Other Ambulatory Visit (HOSPITAL_COMMUNITY): Payer: Self-pay

## 2024-06-02 ENCOUNTER — Other Ambulatory Visit

## 2024-06-07 ENCOUNTER — Ambulatory Visit: Admitting: Internal Medicine

## 2024-07-28 ENCOUNTER — Encounter: Payer: Self-pay | Admitting: Allergy and Immunology

## 2024-07-28 DIAGNOSIS — J4540 Moderate persistent asthma, uncomplicated: Secondary | ICD-10-CM

## 2024-07-28 MED ORDER — BREO ELLIPTA 100-25 MCG/ACT IN AEPB: 1.0000 | INHALATION_SPRAY | Freq: Every day | RESPIRATORY_TRACT | 3 refills | Status: DC

## 2024-08-08 MED ORDER — BREO ELLIPTA 100-25 MCG/ACT IN AEPB: 1.0000 | INHALATION_SPRAY | Freq: Every day | RESPIRATORY_TRACT | 3 refills | Status: AC

## 2024-08-12 ENCOUNTER — Ambulatory Visit: Admitting: Internal Medicine

## 2024-08-15 ENCOUNTER — Other Ambulatory Visit

## 2024-08-15 ENCOUNTER — Ambulatory Visit (INDEPENDENT_AMBULATORY_CARE_PROVIDER_SITE_OTHER): Admitting: Internal Medicine

## 2024-08-15 ENCOUNTER — Encounter: Payer: Self-pay | Admitting: Internal Medicine

## 2024-08-15 VITALS — BP 120/80 | HR 63 | Ht 64.37 in | Wt 151.6 lb

## 2024-08-15 DIAGNOSIS — E039 Hypothyroidism, unspecified: Secondary | ICD-10-CM

## 2024-08-15 NOTE — Progress Notes (Unsigned)
 Name: Kelsey Hart  MRN/ DOB: [id number], [birth year]    Age/ Sex: 73 y.o., female    PCP: Teresa Channel, MD   Reason for Endocrinology Evaluation: Hypothyroidism     Date of Initial Endocrinology Evaluation: 08/11/2022    HPI: Kelsey Hart is a 73 y.o. female with a past medical history of RA, anxiety/stress and SVT. The patient presented for initial endocrinology clinic visit on 08/11/2022  for consultative assistance with her Hypothyroidism.   Pt has been diagnosed with hypothyroidism 1993 She  was on Synthroid  initially but was not happy with it and was switched to Armour thyroid    NO Fh of thyroid  disease    SUBJECTIVE:    Today (08/15/24):  Kelsey Hart is here for a follow up on hypothyroidism.   She continues to take Armour Thyroid  at night, this makes her feel better  The patient missed her lab appointment 7/24 Patient continues to follow-up with Dr. Kozlow for asthma Patient follows with [facility] rheumatology for rheumatoid [facility]  Weight remains stable  Has noted increased irritability  No local neck swelling  Has SVT , no recent palpitations  Has noted heat intolerance at night for the past 2 months  Denies Biotin intake  No tremors    Armour thyroid  120 mg, 1 tab daily  Armour thyroid  130 mg, 1 tab daily      HISTORY:  Past Medical History:  Past Medical History:  Diagnosis Date   Allergy    Arthritis    Rheumatoid   Asthma    Broken ankle 1980   Chronic kidney disease    SVT (supraventricular tachycardia)    Thyroid  disease    Past Surgical History:  Past Surgical History:  Procedure Laterality Date   ANKLE SURGERY  1980   FRACTURE SURGERY      Social History:  reports that she has never smoked. She has never used smokeless tobacco. She reports that she does not drink alcohol and does not use drugs. Family History: family history includes Dementia in her maternal grandmother and mother; Diabetes in her maternal grandmother  and maternal uncle; Heart disease in her father and paternal grandmother; Stroke in her maternal grandfather and paternal grandfather.   HOME MEDICATIONS: Allergies as of 08/15/2024       Reactions   Cat Dander Itching, Cough   Levonorgestrel-ethinyl Estrad Rash        Medication List        Accurate as of August 15, 2024  8:11 AM. If you have any questions, ask your nurse or doctor.          albuterol  108 (90 Base) MCG/ACT inhaler Commonly known as: ProAir  HFA Inhale 2 puffs into the lungs every 4 (four) hours as needed for wheezing or shortness of breath. Inhale two puffs every four to six hours as needed for cough or wheeze.   Breo Ellipta  100-25 MCG/ACT Aepb Generic drug: fluticasone  furoate-vilanterol Inhale 1 puff into the lungs daily.   CRANBERRY PO Take 8,400 mg by mouth daily.   fluticasone  50 MCG/ACT nasal spray Commonly known as: FLONASE  Place 2 sprays into both nostrils daily as needed for allergies or rhinitis (Can use an extra dose during flare ups.).   folic acid 800 MCG tablet Commonly known as: FOLVITE Take 800 mcg by mouth daily.   HUMIRA PEN [place] Inject 40 mg into the skin. Twice monthly.   loratadine  10 MG tablet Commonly known as: CLARITIN  Take 1 tablet (10 mg  total) by mouth daily as needed for allergies (Can take an extra dose during flare ups.).   metoprolol  tartrate 25 MG tablet Commonly known as: LOPRESSOR  Take 25 mg by mouth 2 (two) times daily as needed. As needed   multivitamin tablet Take 1 tablet by mouth daily.   omeprazole  40 MG capsule Commonly known as: PRILOSEC Take 1 capsule (40 mg total) by mouth 2 (two) times daily as needed. TAKE 1 CAPSULE BY MOUTH 1 TO 2 TIMES DAILY   PROBIOTIC DAILY PO Take by mouth daily.   SAM-e 400 MG Tabs Take by mouth daily.   thyroid  120 MG tablet Commonly known as: Armour Thyroid  Take 1 tablet (120 mg total) by mouth daily before breakfast. To be taken in conjunction with Armour Thyroid   30 mg   thyroid  30 MG tablet Commonly known as: Armour Thyroid  Take 1 tablet (30 mg total) by mouth daily before breakfast. To be taken in conjunction with Armour Thyroid  120 mg   TURMERIC PO Take by mouth.   VITAMIN D PO Take 1,000 Units by mouth daily.          REVIEW OF SYSTEMS: A comprehensive ROS was conducted with the patient and is negative except as per HPI    OBJECTIVE:  VS: BP 120/80 (BP Location: Left Arm, Patient Position: Sitting, Cuff Size: Large)   Pulse 63   Ht 5' 4.37 (1.635 m)   Wt 151 lb 9.6 oz (68.8 kg)   SpO2 99%   BMI 25.72 kg/m    Wt Readings from Last 3 Encounters:  08/15/24 151 lb 9.6 oz (68.8 kg)  05/10/24 149 lb 4.8 oz (67.7 kg)  04/15/24 151 lb (68.5 kg)     EXAM: General: Pt appears well and is in NAD  Neck: General: Supple without adenopathy. Thyroid : Thyroid  size normal.  No goiter or nodules appreciated.  Lungs: Clear with good BS bilat   Heart: Auscultation: RRR.  Abdomen: soft, nontender  Extremities:  BL LE: Trace pretibial edema   Mental Status: Judgment, insight: Intact Orientation: Oriented to time, place, and person Mood and affect: No depression, anxiety, or agitation     DATA REVIEWED:   Latest Reference Range & Units 04/12/24 08:30 08/15/24 08:32  TSH 0.40 - 4.50 mIU/L 0.48 0.30 (L)  Triiodothyronine,Free,Serum 2.3 - 4.2 pg/mL 5.7 (H) 4.7 (H)  T4,Free(Direct) 0.8 - 1.8 ng/dL 0.9 0.8  (L): Data is abnormally low (H): Data is abnormally high   ASSESSMENT/PLAN/RECOMMENDATIONS:   Hypothyroidism :  -She used to be on Synthroid  but did not like its effect and  would like to stay on Armour Thyroid  - Patient with irritability and heat intolerance - TSH is low, will decrease Armour Thyroid  as below - Patient was advised to contact the office to have TFT recheck in 6 weeks  Medications :  Continue Armour Thyroid  120 Mg daily Stop Armour Thyroid  30 mg daily Start Armour Thyroid  15 mg daily   F/U in 6 months     Signed electronically by: Stefano Redgie Butts, MD  [facility] Endocrinology  [facility] [address] [place] [place], [place] [address] Phone: [phone] FAX: [phone]   CC: Teresa Channel, MD [phone] [address] [place] [address] Phone: [phone] Fax: [phone]   Return to Endocrinology clinic as below: No future appointments.

## 2024-08-16 ENCOUNTER — Ambulatory Visit: Payer: Self-pay | Admitting: Internal Medicine

## 2024-08-16 LAB — TSH: TSH: 0.3 m[IU]/L — ABNORMAL LOW (ref 0.40–4.50)

## 2024-08-16 LAB — T3, FREE: T3, Free: 4.7 pg/mL — ABNORMAL HIGH (ref 2.3–4.2)

## 2024-08-16 LAB — T4, FREE: Free T4: 0.8 ng/dL (ref 0.8–1.8)

## 2024-08-16 MED ORDER — THYROID 15 MG PO TABS: 15.0000 mg | ORAL_TABLET | Freq: Every day | ORAL | 3 refills | Status: DC

## 2024-09-28 ENCOUNTER — Telehealth: Payer: Self-pay | Admitting: Internal Medicine

## 2024-09-28 ENCOUNTER — Other Ambulatory Visit

## 2024-09-28 NOTE — Telephone Encounter (Signed)
 Patient came in for her labs and wanted a note sent that she has yet to be able to fill her RX for 15MG  Armour Thyroid  .  [facility] has not filled it yet and has not given her a reason  Call [phone]

## 2024-09-29 ENCOUNTER — Ambulatory Visit: Payer: Self-pay | Admitting: Internal Medicine

## 2024-09-29 LAB — TSH: TSH: 1.08 m[IU]/L (ref 0.40–4.50)

## 2024-09-29 LAB — T3, FREE: T3, Free: 3.6 pg/mL (ref 2.3–4.2)

## 2024-09-29 LAB — T4, FREE: Free T4: 0.8 ng/dL (ref 0.8–1.8)

## 2024-09-30 ENCOUNTER — Other Ambulatory Visit: Payer: Self-pay

## 2024-09-30 DIAGNOSIS — E039 Hypothyroidism, unspecified: Secondary | ICD-10-CM

## 2024-09-30 MED ORDER — THYROID 15 MG PO TABS: 15.0000 mg | ORAL_TABLET | Freq: Every day | ORAL | 3 refills | Status: AC

## 2024-12-02 LAB — LAB REPORT - SCANNED
Free T4: 0.62 ng/dL
TSH: 1.29 (ref 0.41–5.90)

## 2025-02-21 ENCOUNTER — Ambulatory Visit: Admitting: Internal Medicine
